# Patient Record
Sex: Female | Born: 1990 | Race: Black or African American | Hispanic: No | Marital: Single | Smoking: Never smoker
Health system: Southern US, Community
[De-identification: ages and names within clinical notes are randomized; demographics above are authoritative.]

## PROBLEM LIST (undated history)

## (undated) ENCOUNTER — Inpatient Hospital Stay (HOSPITAL_COMMUNITY): Payer: Self-pay

## (undated) DIAGNOSIS — O1490 Unspecified pre-eclampsia, unspecified trimester: Secondary | ICD-10-CM

## (undated) DIAGNOSIS — Z9109 Other allergy status, other than to drugs and biological substances: Secondary | ICD-10-CM

## (undated) DIAGNOSIS — D649 Anemia, unspecified: Secondary | ICD-10-CM

## (undated) DIAGNOSIS — O24419 Gestational diabetes mellitus in pregnancy, unspecified control: Secondary | ICD-10-CM

## (undated) HISTORY — DX: Gestational diabetes mellitus in pregnancy, unspecified control: O24.419

## (undated) HISTORY — PX: NO PAST SURGERIES: SHX2092

## (undated) HISTORY — DX: Anemia, unspecified: D64.9

## (undated) HISTORY — DX: Unspecified pre-eclampsia, unspecified trimester: O14.90

## (undated) HISTORY — DX: Other allergy status, other than to drugs and biological substances: Z91.09

---

## 2012-11-29 ENCOUNTER — Emergency Department (HOSPITAL_COMMUNITY)
Admission: EM | Admit: 2012-11-29 | Discharge: 2012-11-29 | Disposition: A | Discharge status: Home / Self Care | Payer: No Typology Code available for payment source | Attending: Emergency Medicine | Admitting: Emergency Medicine

## 2012-11-29 ENCOUNTER — Encounter (HOSPITAL_COMMUNITY): Payer: Self-pay | Admitting: *Deleted

## 2012-11-29 DIAGNOSIS — Y92410 Unspecified street and highway as the place of occurrence of the external cause: Secondary | ICD-10-CM | POA: Insufficient documentation

## 2012-11-29 DIAGNOSIS — Y9389 Activity, other specified: Secondary | ICD-10-CM | POA: Insufficient documentation

## 2012-11-29 DIAGNOSIS — S39012A Strain of muscle, fascia and tendon of lower back, initial encounter: Secondary | ICD-10-CM

## 2012-11-29 DIAGNOSIS — S335XXA Sprain of ligaments of lumbar spine, initial encounter: Secondary | ICD-10-CM | POA: Insufficient documentation

## 2012-11-29 MED ORDER — CYCLOBENZAPRINE HCL 10 MG PO TABS: 10.0000 mg | ORAL_TABLET | Freq: Two times a day (BID) | ORAL | Status: DC | PRN
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
## 2014-06-15 ENCOUNTER — Encounter: Payer: Self-pay | Admitting: Medical

## 2014-06-15 ENCOUNTER — Ambulatory Visit (INDEPENDENT_AMBULATORY_CARE_PROVIDER_SITE_OTHER): Payer: BC Managed Care – PPO | Admitting: Medical

## 2014-06-15 VITALS — BP 110/78 | HR 82 | Temp 98.4°F | Resp 16 | Ht 64.0 in | Wt 248.0 lb

## 2014-06-15 DIAGNOSIS — N62 Hypertrophy of breast: Secondary | ICD-10-CM

## 2014-06-15 DIAGNOSIS — E669 Obesity, unspecified: Secondary | ICD-10-CM

## 2014-06-15 DIAGNOSIS — M545 Low back pain, unspecified: Secondary | ICD-10-CM

## 2014-06-15 LAB — COMPREHENSIVE METABOLIC PANEL
ALBUMIN: 3.8 g/dL (ref 3.5–5.2)
ALT: 9 U/L (ref 0–35)
AST: 14 U/L (ref 0–37)
Alkaline Phosphatase: 57 U/L (ref 39–117)
BUN: 9 mg/dL (ref 6–23)
CALCIUM: 9.2 mg/dL (ref 8.4–10.5)
CHLORIDE: 105 meq/L (ref 96–112)
CO2: 25 mEq/L (ref 19–32)
Creat: 0.7 mg/dL (ref 0.50–1.10)
GLUCOSE: 102 mg/dL — AB (ref 70–99)
POTASSIUM: 4.2 meq/L (ref 3.5–5.3)
Sodium: 139 mEq/L (ref 135–145)
Total Bilirubin: 0.4 mg/dL (ref 0.2–1.2)
Total Protein: 7.2 g/dL (ref 6.0–8.3)

## 2014-06-15 LAB — LIPID PANEL
Cholesterol: 187 mg/dL (ref 0–200)
HDL: 56 mg/dL (ref >39)
LDL Cholesterol: 115 mg/dL — ABNORMAL HIGH (ref 0–99)
Total CHOL/HDL Ratio: 3.3 Ratio
Triglycerides: 82 mg/dL (ref <150)
VLDL: 16 mg/dL (ref 0–40)

## 2014-06-15 NOTE — Progress Notes (Signed)
    Subjective:   Amy Fitzgerald is a 23 y.o. female presenting on 06/15/2014 with Consult about breast reduction  Here today with her mother who is a patient of mine.    She wants to have referral for breast reduction surgery.  Has had large breasts since early high school.  Has been having back pain in the last year that she attributes to the breasts.  She notes her ideal weight is 176 pounds. This is what she weighed in early high school.  She currently is 248 pounds. She currently is not exercising at all, has not been one to exercise in general, played no sports in high school.  She uses no diet discretion, does eat a lot of candy and fast food. Otherwise in usual state of health without complaint  Review of Systems Review of systems as in subjective     Objective:   BP 110/78  Pulse 82  Temp(Src) 98.4 F (36.9 C) (Oral)  Resp 16  Ht 5\' 4"  (1.626 m)  Wt 248 lb (112.492 kg)  BMI 42.55 kg/m2  General appearance: alert, no distress, WD/WN,  Neck: supple, no lymphadenopathy, no thyromegaly, no masses Heart: RRR, normal S1, S2, no murmurs Lungs: CTA bilaterally, no wheezes, rhonchi, or rales Pulses: 2+ symmetric, upper and lower extremities, normal cap refill Ext: no edema      Assessment: Encounter Diagnoses  Name Primary?  . Obesity, unspecified Yes  . Low back pain without sciatica, unspecified back pain laterality   . Large breasts      Plan: We discussed her concerns, back pain that is probably related more to her weight in general than her breast, discussed risk of surgery as well as scarring from breast reduction surgery.  At this time I recommend we focus on general healthy diet and exercise and weight loss.  We spent a long time talking about diet, exercise, setting goals. We'll do some baseline labs for screening.  She seems motivated to work on this, and as long as labs are normal we'll see her back in 2-3 months.  No referral for surgery at this time   Amy Fitzgerald  was seen today for consult about breast reduction.  Diagnoses and associated orders for this visit:  Obesity, unspecified - Comprehensive metabolic panel - Lipid panel - TSH - Hemoglobin A1c  Low back pain without sciatica, unspecified back pain laterality - Comprehensive metabolic panel - Lipid panel - TSH - Hemoglobin A1c  Large breasts - Comprehensive metabolic panel - Lipid panel - TSH - Hemoglobin A1c    Return pending labs, recheck 2-3 mo.

## 2014-06-15 NOTE — Patient Instructions (Signed)
  Thank you for giving me the opportunity to serve you today.    Your diagnosis today includes: Encounter Diagnoses  Name Primary?  . Large breasts Yes  . Low back pain without sciatica, unspecified back pain laterality   . Obesity, unspecified     Specific recommendations today include: Diet  Increase your water intake, get at least 64 ounces of water daily  Eat 3-4 fruits daily  Eat plenty of vegetables throughout the day, preferably each meal  Eat good sources of grains such as oatmeal, barley, whole grain pasta, whole grain bread, but limit the serving size to 1 cup of oatmeal or pasta per meal or 2 slices of bread per meal  We don't need to meat at each meal, however if you do eat meat, limit serving size to the size of your palm, and eat chicken fish or [place], lean cuts of meat  Eat beans every day as this is a good nutrient source and helps to curb appetite  Consider using a program such as Weight Watchers  Consider using a Smart phone app such as My [employer] or [employer] to track your calories and progress   Things to limit or avoid:  Avoid fast food, fried foods, fatty foods  Limit sweets, ice cream, cake and other baked goods  Avoid soda, beer, alcohol, sweet tea  Exercise  You need to be exercising most days of the week for 30-45 minutes or more  Good forms of exercise include walking, hiking, stationary bike or bicycling outside, lap swimming, aerobics class, dance, Zumba  Consider getting a trainer at a gym to help with exercise  Consider weighing yourself daily to keep track of your weight  We will call with lab results.

## 2014-06-16 LAB — HEMOGLOBIN A1C
Hgb A1c MFr Bld: 6.1 % — ABNORMAL HIGH (ref <5.7)
Mean Plasma Glucose: 128 mg/dL — ABNORMAL HIGH (ref <117)

## 2014-06-16 LAB — TSH: TSH: 1.396 u[IU]/mL (ref 0.350–4.500)

## 2014-09-07 ENCOUNTER — Encounter: Payer: Self-pay | Admitting: Medical

## 2014-09-07 ENCOUNTER — Ambulatory Visit (INDEPENDENT_AMBULATORY_CARE_PROVIDER_SITE_OTHER): Payer: BC Managed Care – PPO | Admitting: Medical

## 2014-09-07 VITALS — BP 100/80 | HR 80 | Temp 97.5°F | Resp 16 | Wt 253.0 lb

## 2014-09-07 DIAGNOSIS — E669 Obesity, unspecified: Secondary | ICD-10-CM

## 2014-09-07 DIAGNOSIS — R7309 Other abnormal glucose: Secondary | ICD-10-CM

## 2014-09-07 DIAGNOSIS — M545 Low back pain: Secondary | ICD-10-CM

## 2014-09-07 DIAGNOSIS — R7303 Prediabetes: Secondary | ICD-10-CM

## 2014-09-07 MED ORDER — PHENTERMINE HCL 37.5 MG PO TABS: 37.5000 mg | ORAL_TABLET | Freq: Every day | ORAL | Status: DC

## 2014-09-07 NOTE — Progress Notes (Signed)
Subjective: Here for f/u.  At her first visit in July, we discussed her concerns about back pain, large breasts, and obesity, was found to have borderline diabetes on labs.   2 x /week with trainer for 30 min.  Using weights, row machine, aerobic work.   No other exercise.    Feels like she has made some changes with diet.  drinking some water "enough."   Drinks on average 1 soda daily.   Avoids sweets.  Does eat large portions, but skips breakfast.   Does eat a lot of bread.  Not much with meat.   Is having less back pain and less cramping with periods since exercising.  No other c/o.  Objective: BP 100/80  Pulse 80  Temp(Src) 97.5 F (36.4 C) (Oral)  Resp 16  Wt 253 lb (114.76 kg)  Wt Readings from Last 3 Encounters:  09/07/14 253 lb (114.76 kg)  06/15/14 248 lb (112.492 kg)   General appearance: alert, no distress, WD/WN Heart: RRR, normal S1, S2, no murmurs Lungs: CTA bilaterally, no wheezes, rhonchi, or rales Pulses: 2+ symmetric, upper and lower extremities, normal cap refill   Assessment: Encounter Diagnoses  Name Primary?  . Obesity Yes  . Borderline diabetes   . Low back pain without sciatica, unspecified back pain laterality    Plan: Glad to hear her back pain is much improved.  She has made some diet and exercise changes, but not intense enough.   Discussed her labs and concerns from July.  Discussed the need to be more aggressive with weight loss efforts.  Again reiterated diet recommendations, exercise recommendations, water intake.  discussed medications that can help.   Begin Phentermine.  Discussed risks/benefits of medication.  Declines flu vaccine.  Follow up 1-2 mo.

## 2014-11-07 ENCOUNTER — Ambulatory Visit: Payer: BC Managed Care – PPO | Admitting: Medical

## 2014-11-12 ENCOUNTER — Ambulatory Visit: Payer: BC Managed Care – PPO | Admitting: Medical

## 2014-11-13 ENCOUNTER — Other Ambulatory Visit: Payer: Self-pay | Admitting: Family Medicine

## 2014-11-13 ENCOUNTER — Ambulatory Visit (INDEPENDENT_AMBULATORY_CARE_PROVIDER_SITE_OTHER): Payer: BC Managed Care – PPO | Admitting: Medical

## 2014-11-13 ENCOUNTER — Telehealth: Payer: Self-pay | Admitting: Medical

## 2014-11-13 VITALS — BP 102/80 | HR 75 | Temp 98.2°F | Wt 245.0 lb

## 2014-11-13 DIAGNOSIS — E669 Obesity, unspecified: Secondary | ICD-10-CM

## 2014-11-13 DIAGNOSIS — R7301 Impaired fasting glucose: Secondary | ICD-10-CM

## 2014-11-13 MED ORDER — PHENTERMINE HCL 37.5 MG PO TABS: 37.5000 mg | ORAL_TABLET | Freq: Every day | ORAL | Status: DC

## 2014-11-13 NOTE — Telephone Encounter (Signed)
I placed the orders in EPIC

## 2014-11-13 NOTE — Telephone Encounter (Signed)
Refer to nutritionist 

## 2014-11-13 NOTE — Progress Notes (Signed)
Subjective: Obesity: Here for f/u regarding weight loss efforts.  Last visit for the same was 09/07/14 Current diet: "its crap," doesn't eat breakfast, eats fast food all the time for lunch, dinner Current exercise: goes to [employer] once per week Current medications to assist weight loss efforts: began phentermine 10/2 visit.  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  Interested seeing nutritionist Mood is fine, not getting a lot of sleep.   Wt Readings from Last 3 Encounters:  11/13/14 245 lb (111.131 kg)  09/07/14 253 lb (114.76 kg)  06/15/14 248 lb (112.492 kg)   History of eating disorders: none.  Previous treatments for obesity include none. Obesity associated medical conditions: impaired fasting glucose. Current or past medications associated with causing obesity: none.   ROS as in subjective:  Objective: Filed Vitals:   11/13/14 0851  BP: 102/80  Pulse: 75  Temp: 98.2 F (36.8 C)   General appearance: alert, no distress, WD/WN Oral cavity: MMM, no lesions Neck: supple, no lymphadenopathy, no thyromegaly, no masses Heart: RRR, normal S1, S2, no murmurs Lungs: CTA bilaterally, no wheezes, rhonchi, or rales Abdomen: +bs, soft, non tender, non distended, no masses, no hepatomegaly, no splenomegaly Pulses: 2+ symmetric, upper and lower extremities, normal cap refill Ext: no edema  Assessment: Encounter Diagnoses  Name Primary?  . Obesity Yes  . Impaired fasting blood sugar     Plan: Counseled mainly today on exercise and diet, trying to get away from fast food, eating healthier, getting exercise every day, continue phentermine. She lost 8 pounds since last visit. We'll refer to nutritionist. Follow-up in 2 months.

## 2015-01-01 ENCOUNTER — Ambulatory Visit: Payer: Self-pay | Admitting: Dietician

## 2015-01-14 ENCOUNTER — Encounter: Payer: Self-pay | Admitting: Medical

## 2015-01-14 ENCOUNTER — Ambulatory Visit (INDEPENDENT_AMBULATORY_CARE_PROVIDER_SITE_OTHER): Payer: BC Managed Care – PPO | Admitting: Medical

## 2015-01-14 VITALS — BP 100/80 | HR 76 | Temp 98.1°F | Resp 15 | Wt 240.0 lb

## 2015-01-14 DIAGNOSIS — R7301 Impaired fasting glucose: Secondary | ICD-10-CM

## 2015-01-14 DIAGNOSIS — E669 Obesity, unspecified: Secondary | ICD-10-CM

## 2015-01-14 LAB — POCT GLYCOSYLATED HEMOGLOBIN (HGB A1C): HEMOGLOBIN A1C: 5.7

## 2015-01-14 MED ORDER — PHENTERMINE HCL 37.5 MG PO TABS: 37.5000 mg | ORAL_TABLET | Freq: Every day | ORAL | Status: DC

## 2015-01-14 NOTE — Progress Notes (Signed)
Subjective: Here for f/u on obesity, medication for weight loss, Phentermine.  Last visit 11/13/14 for the same. She is really been trying to work on diet, has really been more careful avoiding sweets.   Has sweet tooth, likes candy.  Lately cooks a lot at home, finds it cheaper to cook at home.  Not as much fast food.  Walks but no other specific exercise.   Works as Conservation officer, naturecashier at [employer].  Her and boyfriend will be starting to go to a gym soon.  Uses phentermine some, not daily, and sometimes forgets to take it but does want to c/t this.  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   No other aggravating or relieving factors. No other complaint.  Wt Readings from Last 3 Encounters:  01/14/15 240 lb (108.863 kg)  11/13/14 245 lb (111.131 kg)  09/07/14 253 lb (114.76 kg)   History of eating disorders: none.  Previous treatments for obesity include none. Obesity associated medical conditions: impaired fasting glucose. Current or past medications associated with causing obesity: none.   ROS as in subjective:  Objective: BP 100/80 mmHg  Pulse 76  Temp(Src) 98.1 F (36.7 C) (Oral)  Resp 15  Wt 240 lb (108.863 kg)  General appearance: alert, no distress, WD/WN Oral cavity: MMM, no lesions Neck: supple, no lymphadenopathy, no thyromegaly, no masses Heart: RRR, normal S1, S2, no murmurs Lungs: CTA bilaterally, no wheezes, rhonchi, or rales Pulses: 2+ symmetric, upper and lower extremities, normal cap refill Ext: no edema  Assessment: Encounter Diagnoses  Name Primary?  . Obesity Yes  . Impaired fasting blood sugar     Plan: HgbA1C 5.7% today.  Improved from last visit.  She has lost 5 more lbs.  Advised c/t efforts with diet, exercise, c/t Phentermine, takes intermittently, but advised daily for the next 2 months .  She will rescheduled visit with nutritionist.  Follow-up in 2 months.

## 2015-01-24 ENCOUNTER — Ambulatory Visit: Payer: Self-pay | Admitting: Dietician

## 2015-08-13 ENCOUNTER — Encounter: Payer: Self-pay | Admitting: Family Medicine

## 2015-08-13 ENCOUNTER — Ambulatory Visit (INDEPENDENT_AMBULATORY_CARE_PROVIDER_SITE_OTHER): Payer: BC Managed Care – PPO | Admitting: Family Medicine

## 2015-08-13 ENCOUNTER — Other Ambulatory Visit: Payer: Self-pay | Admitting: Family Medicine

## 2015-08-13 VITALS — BP 116/78 | HR 64 | Wt 268.8 lb

## 2015-08-13 DIAGNOSIS — Z3200 Encounter for pregnancy test, result unknown: Secondary | ICD-10-CM | POA: Diagnosis not present

## 2015-08-13 DIAGNOSIS — Z3490 Encounter for supervision of normal pregnancy, unspecified, unspecified trimester: Secondary | ICD-10-CM

## 2015-08-13 DIAGNOSIS — Z3201 Encounter for pregnancy test, result positive: Secondary | ICD-10-CM

## 2015-08-13 NOTE — Patient Instructions (Signed)
We will notify you when your lab result is back, tomorrow. Start taking a good multivitamin with 400 mcg of folic acid. Make sure you're eating a healthy diet, staying hydrated, and getting adequate sleep. Avoid starting any new medications.   Commonly Asked Questions During Pregnancy  Cats: A parasite can be excreted in cat feces.  To avoid exposure you need to have another person empty the little box.  If you must empty the litter box you will need to wear gloves.  Wash your hands after handling your cat.  This parasite can also be found in raw or undercooked meat so this should also be avoided.  Colds, Sore Throats, Flu: Please check your medication sheet to see what you can take for symptoms.  If your symptoms are unrelieved by these medications please call the office.  Dental Work: Most any dental work Agricultural consultant recommends is permitted.  X-rays should only be taken during the first trimester if absolutely necessary.  Your abdomen should be shielded with a lead apron during all x-rays.  Please notify your provider prior to receiving any x-rays.  Novocaine is fine; gas is not recommended.  If your dentist requires a note from [place] prior to dental work please call the office and we will provide one for you.  Exercise: Exercise is an important part of staying healthy during your pregnancy.  You may continue most exercises you were accustomed to prior to pregnancy.  Later in your pregnancy you will most likely notice you have difficulty with activities requiring balance like riding a bicycle.  It is important that you listen to your body and avoid activities that put you at a higher risk of falling.  Adequate rest and staying well hydrated are a must!  If you have questions about the safety of specific activities ask your provider.    Exposure to Children with illness: Try to avoid obvious exposure; report any symptoms to [place] when noted,  If you have chicken pos, red measles or mumps, you should be  immune to these diseases.   Please do not take any vaccines while pregnant unless you have checked with your OB provider.  Fetal Movement: After 28 weeks we recommend you do "kick counts" twice daily.  Lie or sit down in a calm quiet environment and count your baby movements "kicks".  You should feel your baby at least 10 times per hour.  If you have not felt 10 kicks within the first hour get up, walk around and have something sweet to eat or drink then repeat for an additional hour.  If count remains less than 10 per hour notify your provider.  Fumigating: Follow your pest control agent's advice as to how long to stay out of your home.  Ventilate the area well before re-entering.  Hemorrhoids:   Most over-the-counter preparations can be used during pregnancy.  Check your medication to see what is safe to use.  It is important to use a stool softener or fiber in your diet and to drink lots of liquids.  If hemorrhoids seem to be getting worse please call the office.   Hot Tubs:  Hot tubs Jacuzzis and saunas are not recommended while pregnant.  These increase your internal body temperature and should be avoided.  Intercourse:  Sexual intercourse is safe during pregnancy as long as you are comfortable, unless otherwise advised by your provider.  Spotting may occur after intercourse; report any bright red bleeding that is heavier than spotting.  Labor:  If you know that you are in labor, please go to the hospital.  If you are unsure, please call the office and let us help you decide what to do.  Lifting, straining, etc:  If your job requires heavy lifting or straining please check with your provider for any limitations.  Generally, you should not lift items heavier than that you can lift simply with your hands and arms (no back muscles)  Painting:  Paint fumes do not harm your pregnancy, but may make you ill and should be avoided if possible.  Latex or water based paints have less odor than oils.  Use  adequate ventilation while painting.  Permanents & Hair Color:  Chemicals in hair dyes are not recommended as they cause increase hair dryness which can increase hair loss during pregnancy.  " Highlighting" and permanents are allowed.  Dye may be absorbed differently and permanents may not hold as well during pregnancy.  Sunbathing:  Use a sunscreen, as skin burns easily during pregnancy.  Drink plenty of fluids; avoid over heating.  Tanning Beds:  Because their possible side effects are still unknown, tanning beds are not recommended.  Ultrasound Scans:  Routine ultrasounds are performed at approximately 20 weeks.  You will be able to see your baby's general anatomy an if you would like to know the gender this can usually be determined as well.  If it is questionable when you conceived you may also receive an ultrasound early in your pregnancy for dating purposes.  Otherwise ultrasound exams are not routinely performed unless there is a medical necessity.  Although you can request a scan we ask that you pay for it when conducted because insurance does not cover " patient request" scans.  Work: If your pregnancy proceeds without complications you may work until your due date, unless your physician or employer advises otherwise.  Round Ligament Pain/Pelvic Discomfort:  Sharp, shooting pains not associated with bleeding are fairly common, usually occurring in the second trimester of pregnancy.  They tend to be worse when standing up or when you remain standing for long periods of time.  These are the result of pressure of certain pelvic ligaments called "round ligaments".  Rest, Tylenol and heat seem to be the most effective relief.  As the womb and fetus grow, they rise out of the pelvis and the discomfort improves.  Please notify the office if your pain seems different than that described.  It may represent a more serious condition.

## 2015-08-13 NOTE — Progress Notes (Signed)
   Subjective:    Patient ID: Amy Fitzgerald, female    DOB: Jan 12, 1991, 24 y.o.   MRN: [id number]  HPI  She is here to confirm pregnancy and states her last period was in July, around the 25th. Reports her cycles are usually every 28 days. She would like to confirm pregnancy. She states she had a positive pregnancy test at home. She also reports breast tenderness and mild lower abdominal cramping on and off for past 2 weeks and states is unrelated to eating or urinating. States she was not planning to become pregnant but it planning to keep the baby if she is indeed pregnant. She denies having any health problems. States she is not taking any medications. Reports that she has never been to an OB/GYN.  Her significant other is with her today. Denies fever, chills, nausea, vomiting, diarrhea, constipation, urinary frequency, dysuria, or odor, back pain. Denies vaginal bleeding or discharge.  Discussed that her labs last year showed that she had impaired fasting glucose and she states that she did not follow-up with the nutritionist as recommended. States she has not been dieting or exercising.  Reviewed allergies, medications, past medical history. She has no previous pregnancies. She denies smoking, alcohol use, drug use.  Review of Systems Pertinent positives and negatives in the history of present illness.    Objective:   Physical Exam  Constitutional: She is oriented to person, place, and time. She appears well-developed and well-nourished. No distress.  Neurological: She is alert and oriented to person, place, and time.  Skin: Skin is warm and dry. No rash noted.  Psychiatric: She has a normal mood and affect. Her speech is normal and behavior is normal. Judgment and thought content normal. Cognition and memory are normal.          Assessment & Plan:  Encounter for pregnancy test - Plan: hCG, serum, qualitative  Discussed that we will refer her to OB/GYN pending positive serum pregnancy  test. Advised her to start taking Multi-vitamin with  of Folic Acid. She will avoid taking any medications until consulting with [place] or her OB/GYN. Encouraged her to continue taking good care of herself by eating properly, staying hydrated, walking for physical activity and getting adequate sleep. Encouraged her to get a flu shot today. She refused and states she does not get them due to religious reasons.

## 2015-08-14 LAB — HCG, SERUM, QUALITATIVE: PREG SERUM: POSITIVE

## 2015-08-14 LAB — HCG, QUANTITATIVE, PREGNANCY: hCG, Beta Chain, Quant, S: 28933.6 m[IU]/mL

## 2015-08-14 NOTE — Addendum Note (Signed)
Addended by: Herminio Commons A on: 08/14/2015 12:45 PM   Modules accepted: Orders

## 2015-08-15 ENCOUNTER — Encounter: Payer: Self-pay | Admitting: Family Medicine

## 2015-09-03 LAB — OB RESULTS CONSOLE HIV ANTIBODY (ROUTINE TESTING): HIV: NONREACTIVE

## 2015-09-03 LAB — OB RESULTS CONSOLE ABO/RH: RH TYPE: POSITIVE

## 2015-09-03 LAB — OB RESULTS CONSOLE RUBELLA ANTIBODY, IGM: Rubella: IMMUNE

## 2015-09-03 LAB — OB RESULTS CONSOLE HEPATITIS B SURFACE ANTIGEN: Hepatitis B Surface Ag: NEGATIVE

## 2015-09-03 LAB — OB RESULTS CONSOLE RPR: RPR: NONREACTIVE

## 2015-09-03 LAB — OB RESULTS CONSOLE ANTIBODY SCREEN: Antibody Screen: NEGATIVE

## 2015-09-03 LAB — OB RESULTS CONSOLE GC/CHLAMYDIA
Chlamydia: NEGATIVE
GC PROBE AMP, GENITAL: NEGATIVE

## 2015-12-06 ENCOUNTER — Emergency Department (HOSPITAL_COMMUNITY)
Admission: EM | Admit: 2015-12-06 | Discharge: 2015-12-06 | Disposition: A | Discharge status: Home / Self Care | Payer: Medicaid Other | Attending: Emergency Medicine | Admitting: Emergency Medicine

## 2015-12-06 ENCOUNTER — Encounter (HOSPITAL_COMMUNITY): Payer: Self-pay | Admitting: Emergency Medicine

## 2015-12-06 DIAGNOSIS — Z79899 Other long term (current) drug therapy: Secondary | ICD-10-CM | POA: Diagnosis not present

## 2015-12-06 DIAGNOSIS — K529 Noninfective gastroenteritis and colitis, unspecified: Secondary | ICD-10-CM | POA: Diagnosis not present

## 2015-12-06 DIAGNOSIS — O9989 Other specified diseases and conditions complicating pregnancy, childbirth and the puerperium: Secondary | ICD-10-CM | POA: Diagnosis present

## 2015-12-06 DIAGNOSIS — O99612 Diseases of the digestive system complicating pregnancy, second trimester: Secondary | ICD-10-CM | POA: Insufficient documentation

## 2015-12-06 DIAGNOSIS — Z3490 Encounter for supervision of normal pregnancy, unspecified, unspecified trimester: Secondary | ICD-10-CM

## 2015-12-06 DIAGNOSIS — Z3A22 22 weeks gestation of pregnancy: Secondary | ICD-10-CM | POA: Insufficient documentation

## 2015-12-06 LAB — URINALYSIS, ROUTINE W REFLEX MICROSCOPIC
Bilirubin Urine: NEGATIVE
Glucose, UA: NEGATIVE mg/dL
Hgb urine dipstick: NEGATIVE
Ketones, ur: NEGATIVE mg/dL
Nitrite: NEGATIVE
Protein, ur: NEGATIVE mg/dL
Specific Gravity, Urine: 1.021 (ref 1.005–1.030)
pH: 6 (ref 5.0–8.0)

## 2015-12-06 LAB — COMPREHENSIVE METABOLIC PANEL
ALT: 14 U/L (ref 14–54)
AST: 20 U/L (ref 15–41)
Albumin: 3.1 g/dL — ABNORMAL LOW (ref 3.5–5.0)
Alkaline Phosphatase: 49 U/L (ref 38–126)
Anion gap: 8 (ref 5–15)
BUN: 6 mg/dL (ref 6–20)
CO2: 22 mmol/L (ref 22–32)
Calcium: 8.7 mg/dL — ABNORMAL LOW (ref 8.9–10.3)
Chloride: 105 mmol/L (ref 101–111)
Creatinine, Ser: 0.57 mg/dL (ref 0.44–1.00)
GFR calc Af Amer: >60 mL/min (ref >60)
GFR calc non Af Amer: >60 mL/min (ref >60)
Glucose, Bld: 99 mg/dL (ref 65–99)
Potassium: 3.4 mmol/L — ABNORMAL LOW (ref 3.5–5.1)
Sodium: 135 mmol/L (ref 135–145)
Total Bilirubin: 0.5 mg/dL (ref 0.3–1.2)
Total Protein: 7.2 g/dL (ref 6.5–8.1)

## 2015-12-06 LAB — CBC
HCT: 35 % — ABNORMAL LOW (ref 36.0–46.0)
Hemoglobin: 11.5 g/dL — ABNORMAL LOW (ref 12.0–15.0)
MCH: 27.6 pg (ref 26.0–34.0)
MCHC: 32.9 g/dL (ref 30.0–36.0)
MCV: 83.9 fL (ref 78.0–100.0)
Platelets: 267 10*3/uL (ref 150–400)
RBC: 4.17 MIL/uL (ref 3.87–5.11)
RDW: 13.4 % (ref 11.5–15.5)
WBC: 5.8 10*3/uL (ref 4.0–10.5)

## 2015-12-06 LAB — URINE MICROSCOPIC-ADD ON

## 2015-12-06 LAB — LIPASE, BLOOD: Lipase: 25 U/L (ref 11–51)

## 2015-12-06 LAB — I-STAT BETA HCG BLOOD, ED (MC, WL, AP ONLY): I-stat hCG, quantitative: >2000 m[IU]/mL — ABNORMAL HIGH (ref <5)

## 2015-12-06 MED ORDER — PROMETHAZINE HCL 25 MG PO TABS: 25.0000 mg | ORAL_TABLET | Freq: Three times a day (TID) | ORAL | Status: DC | PRN

## 2015-12-06 MED ORDER — SODIUM CHLORIDE 0.9 % IV BOLUS (SEPSIS)
1000.0000 mL | Freq: Once | INTRAVENOUS | Status: AC
  Administered 2015-12-06: 1000 mL via INTRAVENOUS

## 2015-12-06 MED ORDER — ONDANSETRON HCL 4 MG/2ML IJ SOLN
4.0000 mg | Freq: Once | INTRAMUSCULAR | Status: AC
  Administered 2015-12-06: 4 mg via INTRAVENOUS
  Filled 2015-12-06: qty 2

## 2015-12-06 NOTE — ED Notes (Signed)
OB RN at bedside

## 2015-12-06 NOTE — Progress Notes (Signed)
1152 Arrived to evaluate this 24yo G1P0 @ 22.[redacted] wks GA in with report of nausea, vomiting, diarrhea. Reports hematemesis.  Denies bleeding or leaking of fluid from vagina.  Reports feeling fetal movement as "fluttering". FHT's 145 suprapubic.  1207  Spoke with Dr. Su Hiltoberts and informed her of above.  She states patient can be OB cleared.

## 2015-12-06 NOTE — Discharge Instructions (Signed)
Return here as needed.  Follow-up with your GYN doctor, increase your fluid intake

## 2015-12-06 NOTE — ED Notes (Signed)
1x unsuccessful lab draw R hand

## 2015-12-06 NOTE — ED Provider Notes (Signed)
CSN: [id number]     Arrival date & time 12/06/15  1117 History   First MD Initiated Contact with Patient 12/06/15 1345     No chief complaint on file.    (Consider location/radiation/quality/duration/timing/severity/associated sxs/prior Treatment) HPI Patient presents to the emergency department with nausea, vomiting and diarrhea over the last 36 hours.  Patient states that he is [redacted] weeks pregnant.  Feels like she caught a GI bug.  She states she noticed a little streak in the vomitus.  Patient states she does not have any abdominal discomfort at this time.  The patient states she did have some crampy abdominal pain yesterday.  Patient states that her diarrhea has gotten better but she still had some vomiting this morning.  The patient denies headache, blurred vision, back pain, neck pain, fever, dysuria, incontinence, bloody stool, cough, chest pain, shortness of breath, rash, near syncope or syncope.  She states that nothing seems make her condition better or worse History reviewed. No pertinent past medical history. History reviewed. No pertinent past surgical history. No family history on file. Social History  Substance Use Topics  . Smoking status: Never Smoker   . Smokeless tobacco: None  . Alcohol Use: No   OB History    Gravida Para Term Preterm AB TAB SAB Ectopic Multiple Living   1              Review of Systems All other systems negative except as documented in the HPI. All pertinent positives and negatives as reviewed in the HPI.   Allergies  Review of patient's allergies indicates no known allergies.  Home Medications   Prior to Admission medications   Medication Sig Start Date End Date Taking? Authorizing Provider  Prenatal Multivit-Min-Fe-FA (PRE-NATAL PO) Take 1 tablet by mouth daily.   Yes Historical Provider, MD   BP 126/101 mmHg  Pulse 81  Temp(Src) 98.6 F (37 C) (Oral)  Resp 16  SpO2 100%  LMP 07/07/2015 Physical Exam  Constitutional: She is oriented  to person, place, and time. She appears well-developed and well-nourished. No distress.  HENT:  Head: Normocephalic and atraumatic.  Mouth/Throat: Oropharynx is clear and moist.  Eyes: Pupils are equal, round, and reactive to light.  Neck: Normal range of motion. Neck supple.  Cardiovascular: Normal rate, regular rhythm and normal heart sounds.  Exam reveals no gallop and no friction rub.   No murmur heard. Pulmonary/Chest: Effort normal and breath sounds normal. No respiratory distress. She has no wheezes.  Abdominal: Soft. Bowel sounds are normal. She exhibits no distension. There is no tenderness.  Neurological: She is alert and oriented to person, place, and time. She exhibits normal muscle tone. Coordination normal.  Skin: Skin is warm and dry. No rash noted. No erythema.  Psychiatric: She has a normal mood and affect. Her behavior is normal.  Nursing note and vitals reviewed.   ED Course  Procedures (including critical care time) Labs Review Labs Reviewed  COMPREHENSIVE METABOLIC PANEL - Abnormal; Notable for the following:    Potassium 3.4 (*)    Calcium 8.7 (*)    Albumin 3.1 (*)    All other components within normal limits  CBC - Abnormal; Notable for the following:    Hemoglobin 11.5 (*)    HCT 35.0 (*)    All other components within normal limits  URINALYSIS, ROUTINE W REFLEX MICROSCOPIC (NOT [facility]) - Abnormal; Notable for the following:    Color, Urine AMBER (*)    APPearance TURBID (*)  Leukocytes, UA LARGE (*)    All other components within normal limits  URINE MICROSCOPIC-ADD ON - Abnormal; Notable for the following:    Squamous Epithelial / LPF TOO NUMEROUS TO COUNT (*)    Bacteria, UA MANY (*)    All other components within normal limits  I-STAT BETA HCG BLOOD, ED ([facility], [facility], [facility] ONLY) - Abnormal; Notable for the following:    I-stat hCG, quantitative >2000.0 (*)    All other components within normal limits  URINE CULTURE  LIPASE, BLOOD    Imaging  Review No results found. I have personally reviewed and evaluated these images and lab results as part of my medical decision-making.  Patient be treated for GI type illness.  She is stable here in the emergency department does not have any abdominal pain at this time.  I did culture her urine as there is 2.80 count epithelials, along with bacteria.  Patient is advised follow-up with her primary care doctor and OB    Charlestine Night, [place] 12/06/15 1552  Raeford Razor, MD 12/13/15 1152

## 2015-12-06 NOTE — ED Notes (Signed)
Pt 22 weeks 4 days pregnant c/o hematemesis with blood streaked mucus, diffuse crampy abdominal pain, diarrhea. No vaginal discharge or bleeding.

## 2015-12-08 LAB — URINE CULTURE

## 2015-12-08 NOTE — L&D Delivery Note (Signed)
Called by RN advising that pt was pushing involuntarily and needed to urinate and possibly have a BM. She attempted to get out of bed, but the pain was too unbearable and she was assisted back to bed. Overall reassuring FHRT. +Earlys, mild variables, no lates. Ctxs every 2-3 min, palpate moderate. Cervical exam per RN = C/C/+1. Advised that she may begin pushing. In less than 5 min, called to room for imminent delivery (head on the perineum).    Delivery Note At 5:35 AM a viable female "Amy Fitzgerald" was delivered via Vaginal, Spontaneous Delivery (Presentation: OA restituting to Right Occiput Anterior). APGARS: 8, 9; weight 7 lbs 15.9 oz (3626 g).   Placenta status: Intact, Spontaneous Schultz - mec stained. Cord: 3 vessels with the following complications: None. Cord pH: NA.  Anesthesia: Local for repair.  Episiotomy: None. Lacerations: 2nd degree vaginal; bilateral inner labia. Suture Repair: 2-0; 3-0 vicryl CT-1. Est. Blood Loss (mL): 300  Mom to postpartum.  Baby to Couplet care / Skin to Skin.  Mom plans to breastfeed/pump.  Undecided re: birth control.  Placenta to path.  Due to preE w/ severe features, will begin Magnesium Sulfate x 24 hrs. Pt asymptomatic. Will obtain preE labs and Magnesium level in am, sooner if indicated. Continue IV anti-hypertensive therapy prn.  Amy Fitzgerald, Marlise Fahr 04/01/2016, 7:03 AM

## 2016-01-23 ENCOUNTER — Inpatient Hospital Stay (HOSPITAL_COMMUNITY): Payer: Medicaid Other

## 2016-01-23 ENCOUNTER — Inpatient Hospital Stay (HOSPITAL_COMMUNITY)
Admission: AD | Admit: 2016-01-23 | Discharge: 2016-01-23 | Disposition: A | Discharge status: Home / Self Care | Payer: Medicaid Other | Source: Ambulatory Visit | Attending: Obstetrics & Gynecology | Admitting: Obstetrics & Gynecology

## 2016-01-23 ENCOUNTER — Encounter (HOSPITAL_COMMUNITY): Payer: Self-pay | Admitting: *Deleted

## 2016-01-23 DIAGNOSIS — O26899 Other specified pregnancy related conditions, unspecified trimester: Secondary | ICD-10-CM

## 2016-01-23 DIAGNOSIS — O26893 Other specified pregnancy related conditions, third trimester: Secondary | ICD-10-CM | POA: Diagnosis not present

## 2016-01-23 DIAGNOSIS — O99213 Obesity complicating pregnancy, third trimester: Secondary | ICD-10-CM | POA: Diagnosis not present

## 2016-01-23 DIAGNOSIS — Z3A29 29 weeks gestation of pregnancy: Secondary | ICD-10-CM

## 2016-01-23 DIAGNOSIS — O368390 Maternal care for abnormalities of the fetal heart rate or rhythm, unspecified trimester, not applicable or unspecified: Secondary | ICD-10-CM

## 2016-01-23 DIAGNOSIS — R109 Unspecified abdominal pain: Secondary | ICD-10-CM | POA: Diagnosis present

## 2016-01-23 DIAGNOSIS — O288 Other abnormal findings on antenatal screening of mother: Secondary | ICD-10-CM

## 2016-01-23 LAB — URINE MICROSCOPIC-ADD ON

## 2016-01-23 LAB — URINALYSIS, ROUTINE W REFLEX MICROSCOPIC
BILIRUBIN URINE: NEGATIVE
GLUCOSE, UA: NEGATIVE mg/dL
Hgb urine dipstick: NEGATIVE
KETONES UR: NEGATIVE mg/dL
NITRITE: NEGATIVE
PH: 5.5 (ref 5.0–8.0)
PROTEIN: NEGATIVE mg/dL
Specific Gravity, Urine: <1.005 — ABNORMAL LOW (ref 1.005–1.030)

## 2016-01-23 NOTE — MAU Provider Note (Signed)
History   25 yo G1P0 at 34 3/7 weeks presented unannounced c/o pain/pressure in upper/mid abdomen while at home, after eating waffles.  Reports she drank ginger ale and burped, and the pain resolved--it has not recurred.  Denies N/V, diarrhea, constipation, bleeding, leaking, contractions, dysuria, discharge, fever, reflux/heartburn or any other issues.  No previous occurrences.  Last seen at [facility] 01/21/16, with plan for initiation of BPPs at [facility] due to BMI.   Patient Active Problem List   Diagnosis Date Noted  . Morbid obesity (HCC) 01/23/2016    Chief Complaint  Patient presents with  . Abdominal Pain   HPI:  See above  OB History    Gravida Para Term Preterm AB TAB SAB Ectopic Multiple Living   1         0      Past Medical History  Diagnosis Date  . Medical history non-contributory     Past Surgical History  Procedure Laterality Date  . No past surgeries      History reviewed. No pertinent family history.  Social History  Substance Use Topics  . Smoking status: Never Smoker   . Smokeless tobacco: None  . Alcohol Use: No    Allergies: No Known Allergies  Prescriptions prior to admission  Medication Sig Dispense Refill Last Dose  . Prenatal Multivit-Min-Fe-FA (PRE-NATAL PO) Take 1 tablet by mouth daily.   Past Month at Unknown time  . promethazine (PHENERGAN) 25 MG tablet Take 1 tablet (25 mg total) by mouth every 8 (eight) hours as needed for nausea or vomiting. (Patient not taking: Reported on 01/23/2016) 10 tablet 0     ROS:  Previous upper abdominal pain/pressure, now resolved. Physical Exam   Blood pressure 132/76, pulse 91, temperature 98.8 F (37.1 C), resp. rate 18, height  (1.626 m), weight 120.748 kg (266 lb 3.2 oz), last menstrual period 07/07/2015.    Physical Exam  In NAD Chest clear Heart RRR without murmur Abd gravid, NT Pelvic--deferred Ext WNL  FHR Category 1, but not classically reactive UCs none  ED Course   Assessment: IUP at 29 3/7 weeks LIkely reflux/gas issue--now resolved Non-reactive FHR Morbid obesity  Plan: BPP   Nigel Bridgeman CNM, MSN 01/23/2016 2:26 PM  Addendum: Returned from US--BPP 8/8, AFI 12.51, 33%ile, vtx.   Results for orders placed or performed during the hospital encounter of 01/23/16 (from the past 24 hour(s))  Urinalysis, Routine w reflex microscopic (not at [facility])     Status: Abnormal   Collection Time: 01/23/16 12:48 PM  Result Value Ref Range   Color, Urine YELLOW YELLOW   APPearance CLEAR CLEAR   Specific Gravity, Urine <1.005 (L) 1.005 - 1.030   pH 5.5 5.0 - 8.0   Glucose, UA NEGATIVE NEGATIVE mg/dL   Hgb urine dipstick NEGATIVE NEGATIVE   Bilirubin Urine NEGATIVE NEGATIVE   Ketones, ur NEGATIVE NEGATIVE mg/dL   Protein, ur NEGATIVE NEGATIVE mg/dL   Nitrite NEGATIVE NEGATIVE   Leukocytes, UA MODERATE (A) NEGATIVE  Urine microscopic-add on     Status: Abnormal   Collection Time: 01/23/16 12:48 PM  Result Value Ref Range   Squamous Epithelial / LPF 0-5 (A) NONE SEEN   WBC, UA 6-30 0 - 5 WBC/hpf   RBC / HPF 0-5 0 - 5 RBC/hpf   Bacteria, UA MANY (A) NONE SEEN   Urine-Other MUCOUS PRESENT     D/c home with instructions to watch dietary intake of gas producing foods, increase fluids. Keep scheduled appt at  [facility] 02/04/16. FKCs discussed.  Nigel Bridgeman, CNM 01/23/16 2:51p

## 2016-01-23 NOTE — Discharge Instructions (Signed)
Watch intake of gas producing foods Make sure you are drinking lots of water. Call for any questions or concerns.

## 2016-01-23 NOTE — MAU Note (Signed)
Pt reports having abd pain in her mid upper abd. Not hurting currently.Stated she drank some ginger ale and burped some and felt better but she was worried and wanted to get checked out.  Denies vaginal bleeding or discharge.

## 2016-03-19 LAB — OB RESULTS CONSOLE GBS: GBS: POSITIVE

## 2016-03-31 ENCOUNTER — Encounter (HOSPITAL_COMMUNITY): Payer: Self-pay | Admitting: *Deleted

## 2016-03-31 ENCOUNTER — Inpatient Hospital Stay (HOSPITAL_COMMUNITY)
Admission: AD | Admit: 2016-03-31 | Discharge: 2016-04-03 | DRG: 775 | Disposition: A | Discharge status: Home / Self Care | Payer: Medicaid Other | Source: Ambulatory Visit | Attending: Obstetrics and Gynecology | Admitting: Obstetrics and Gynecology

## 2016-03-31 DIAGNOSIS — Z3A39 39 weeks gestation of pregnancy: Secondary | ICD-10-CM | POA: Diagnosis not present

## 2016-03-31 DIAGNOSIS — Z9109 Other allergy status, other than to drugs and biological substances: Secondary | ICD-10-CM

## 2016-03-31 DIAGNOSIS — O9952 Diseases of the respiratory system complicating childbirth: Secondary | ICD-10-CM | POA: Diagnosis present

## 2016-03-31 DIAGNOSIS — Z833 Family history of diabetes mellitus: Secondary | ICD-10-CM | POA: Diagnosis not present

## 2016-03-31 DIAGNOSIS — J301 Allergic rhinitis due to pollen: Secondary | ICD-10-CM | POA: Diagnosis present

## 2016-03-31 DIAGNOSIS — Z6841 Body Mass Index (BMI) 40.0 and over, adult: Secondary | ICD-10-CM

## 2016-03-31 DIAGNOSIS — O99824 Streptococcus B carrier state complicating childbirth: Secondary | ICD-10-CM | POA: Diagnosis present

## 2016-03-31 DIAGNOSIS — O1414 Severe pre-eclampsia complicating childbirth: Secondary | ICD-10-CM | POA: Diagnosis present

## 2016-03-31 DIAGNOSIS — B951 Streptococcus, group B, as the cause of diseases classified elsewhere: Secondary | ICD-10-CM

## 2016-03-31 DIAGNOSIS — O99214 Obesity complicating childbirth: Secondary | ICD-10-CM | POA: Diagnosis present

## 2016-03-31 DIAGNOSIS — O1400 Mild to moderate pre-eclampsia, unspecified trimester: Secondary | ICD-10-CM | POA: Diagnosis present

## 2016-03-31 HISTORY — DX: Other allergy status, other than to drugs and biological substances: Z91.09

## 2016-03-31 LAB — COMPREHENSIVE METABOLIC PANEL
ALBUMIN: 2.5 g/dL — AB (ref 3.5–5.0)
ALT: 8 U/L — AB (ref 14–54)
AST: 15 U/L (ref 15–41)
Alkaline Phosphatase: 70 U/L (ref 38–126)
Anion gap: 4 — ABNORMAL LOW (ref 5–15)
BUN: 8 mg/dL (ref 6–20)
CHLORIDE: 109 mmol/L (ref 101–111)
CO2: 23 mmol/L (ref 22–32)
CREATININE: 0.75 mg/dL (ref 0.44–1.00)
Calcium: 9.2 mg/dL (ref 8.9–10.3)
GFR calc Af Amer: >60 mL/min (ref >60)
GFR calc non Af Amer: >60 mL/min (ref >60)
GLUCOSE: 103 mg/dL — AB (ref 65–99)
POTASSIUM: 4.6 mmol/L (ref 3.5–5.1)
SODIUM: 136 mmol/L (ref 135–145)
Total Bilirubin: 0.5 mg/dL (ref 0.3–1.2)
Total Protein: 6.6 g/dL (ref 6.5–8.1)

## 2016-03-31 LAB — PROTEIN / CREATININE RATIO, URINE
Creatinine, Urine: 160 mg/dL
PROTEIN CREATININE RATIO: 1.91 mg/mg{creat} — AB (ref 0.00–0.15)
Total Protein, Urine: 306 mg/dL

## 2016-03-31 LAB — CBC
HCT: 33.8 % — ABNORMAL LOW (ref 36.0–46.0)
Hemoglobin: 11 g/dL — ABNORMAL LOW (ref 12.0–15.0)
MCH: 27.3 pg (ref 26.0–34.0)
MCHC: 32.5 g/dL (ref 30.0–36.0)
MCV: 83.9 fL (ref 78.0–100.0)
PLATELETS: 248 10*3/uL (ref 150–400)
RBC: 4.03 MIL/uL (ref 3.87–5.11)
RDW: 13.8 % (ref 11.5–15.5)
WBC: 8.5 10*3/uL (ref 4.0–10.5)

## 2016-03-31 LAB — LACTATE DEHYDROGENASE: LDH: 185 U/L (ref 98–192)

## 2016-03-31 LAB — URIC ACID: URIC ACID, SERUM: 4.3 mg/dL (ref 2.3–6.6)

## 2016-03-31 MED ORDER — MISOPROSTOL 25 MCG QUARTER TABLET
25.0000 ug | ORAL_TABLET | ORAL | Status: DC | PRN
  Filled 2016-03-31: qty 0.25

## 2016-03-31 MED ORDER — ONDANSETRON HCL 4 MG/2ML IJ SOLN: 4.0000 mg | Freq: Four times a day (QID) | INTRAMUSCULAR | Status: DC | PRN

## 2016-03-31 MED ORDER — LACTATED RINGERS IV SOLN
500.0000 mL | INTRAVENOUS | Status: DC | PRN
  Administered 2016-04-01: 500 mL via INTRAVENOUS

## 2016-03-31 MED ORDER — LACTATED RINGERS IV SOLN
INTRAVENOUS | Status: DC
  Administered 2016-04-01: 01:00:00 via INTRAVENOUS

## 2016-03-31 MED ORDER — FLEET ENEMA 7-19 GM/118ML RE ENEM: 1.0000 | ENEMA | RECTAL | Status: DC | PRN

## 2016-03-31 MED ORDER — CITRIC ACID-SODIUM CITRATE 334-500 MG/5ML PO SOLN: 30.0000 mL | ORAL | Status: DC | PRN

## 2016-03-31 MED ORDER — OXYTOCIN 10 UNIT/ML IJ SOLN
1.0000 m[IU]/min | INTRAVENOUS | Status: DC
  Administered 2016-04-01: 2 m[IU]/min via INTRAVENOUS
  Filled 2016-03-31: qty 4

## 2016-03-31 MED ORDER — OXYTOCIN BOLUS FROM INFUSION: 500.0000 mL | INTRAVENOUS | Status: DC

## 2016-03-31 MED ORDER — OXYTOCIN 10 UNIT/ML IJ SOLN: 2.5000 [IU]/h | INTRAVENOUS | Status: DC

## 2016-03-31 MED ORDER — MAGNESIUM SULFATE BOLUS VIA INFUSION
4.0000 g | Freq: Once | INTRAVENOUS | Status: DC
  Filled 2016-03-31: qty 500

## 2016-03-31 MED ORDER — OXYCODONE-ACETAMINOPHEN 5-325 MG PO TABS: 2.0000 | ORAL_TABLET | ORAL | Status: DC | PRN

## 2016-03-31 MED ORDER — LACTATED RINGERS IV SOLN: 500.0000 mL | INTRAVENOUS | Status: DC | PRN

## 2016-03-31 MED ORDER — PENICILLIN G POTASSIUM 5000000 UNITS IJ SOLR
5.0000 10*6.[IU] | Freq: Once | INTRAVENOUS | Status: AC
  Administered 2016-04-01: 5 10*6.[IU] via INTRAVENOUS
  Filled 2016-03-31: qty 5

## 2016-03-31 MED ORDER — ACETAMINOPHEN 325 MG PO TABS: 650.0000 mg | ORAL_TABLET | ORAL | Status: DC | PRN

## 2016-03-31 MED ORDER — OXYCODONE-ACETAMINOPHEN 5-325 MG PO TABS: 1.0000 | ORAL_TABLET | ORAL | Status: DC | PRN

## 2016-03-31 MED ORDER — PENICILLIN G POTASSIUM 5000000 UNITS IJ SOLR
2.5000 10*6.[IU] | INTRAVENOUS | Status: DC
  Administered 2016-04-01: 2.5 10*6.[IU] via INTRAVENOUS
  Filled 2016-03-31 (×3): qty 2.5

## 2016-03-31 MED ORDER — LIDOCAINE HCL (PF) 1 % IJ SOLN: 30.0000 mL | INTRAMUSCULAR | Status: DC | PRN

## 2016-03-31 MED ORDER — LACTATED RINGERS IV SOLN
2.0000 g/h | INTRAVENOUS | Status: DC
  Administered 2016-04-02: 2 g/h via INTRAVENOUS
  Filled 2016-03-31 (×3): qty 80

## 2016-03-31 MED ORDER — NALBUPHINE HCL 10 MG/ML IJ SOLN
10.0000 mg | INTRAMUSCULAR | Status: DC | PRN
  Administered 2016-04-01 (×2): 10 mg via INTRAVENOUS
  Filled 2016-03-31 (×2): qty 1

## 2016-03-31 MED ORDER — LABETALOL HCL 5 MG/ML IV SOLN: 20.0000 mg | INTRAVENOUS | Status: DC | PRN

## 2016-03-31 MED ORDER — HYDRALAZINE HCL 20 MG/ML IJ SOLN
10.0000 mg | Freq: Once | INTRAMUSCULAR | Status: AC | PRN
  Administered 2016-04-01: 10 mg via INTRAVENOUS
  Filled 2016-03-31: qty 1

## 2016-03-31 MED ORDER — TERBUTALINE SULFATE 1 MG/ML IJ SOLN: 0.2500 mg | Freq: Once | INTRAMUSCULAR | Status: DC | PRN

## 2016-03-31 MED ORDER — LACTATED RINGERS IV SOLN: INTRAVENOUS | Status: DC

## 2016-03-31 NOTE — OB Triage Note (Signed)
Pt reports uc's every 5 min x 30 min.  No leaking of fluid.

## 2016-04-01 ENCOUNTER — Encounter (HOSPITAL_COMMUNITY): Payer: Self-pay | Admitting: Anesthesiology

## 2016-04-01 ENCOUNTER — Encounter (HOSPITAL_COMMUNITY): Payer: Self-pay | Admitting: *Deleted

## 2016-04-01 LAB — ABO/RH: ABO/RH(D): A POS

## 2016-04-01 LAB — TYPE AND SCREEN
ABO/RH(D): A POS
Antibody Screen: NEGATIVE

## 2016-04-01 LAB — RPR: RPR: NONREACTIVE

## 2016-04-01 MED ORDER — DIPHENHYDRAMINE HCL 25 MG PO CAPS: 25.0000 mg | ORAL_CAPSULE | Freq: Four times a day (QID) | ORAL | Status: DC | PRN

## 2016-04-01 MED ORDER — OXYCODONE-ACETAMINOPHEN 5-325 MG PO TABS: 2.0000 | ORAL_TABLET | ORAL | Status: DC | PRN

## 2016-04-01 MED ORDER — LACTATED RINGERS IV SOLN
INTRAVENOUS | Status: DC
  Administered 2016-04-01 – 2016-04-02 (×3): via INTRAVENOUS

## 2016-04-01 MED ORDER — SIMETHICONE 80 MG PO CHEW: 80.0000 mg | CHEWABLE_TABLET | ORAL | Status: DC | PRN

## 2016-04-01 MED ORDER — DIBUCAINE 1 % RE OINT
1.0000 "application " | TOPICAL_OINTMENT | RECTAL | Status: DC | PRN
  Filled 2016-04-01: qty 28

## 2016-04-01 MED ORDER — BENZOCAINE-MENTHOL 20-0.5 % EX AERO
1.0000 "application " | INHALATION_SPRAY | CUTANEOUS | Status: DC | PRN
  Administered 2016-04-01: 1 via TOPICAL
  Filled 2016-04-01 (×2): qty 56

## 2016-04-01 MED ORDER — ZOLPIDEM TARTRATE 5 MG PO TABS: 5.0000 mg | ORAL_TABLET | Freq: Every evening | ORAL | Status: DC | PRN

## 2016-04-01 MED ORDER — WITCH HAZEL-GLYCERIN EX PADS: 1.0000 "application " | MEDICATED_PAD | CUTANEOUS | Status: DC | PRN

## 2016-04-01 MED ORDER — IBUPROFEN 600 MG PO TABS
600.0000 mg | ORAL_TABLET | Freq: Four times a day (QID) | ORAL | Status: DC
  Administered 2016-04-01 – 2016-04-03 (×9): 600 mg via ORAL
  Filled 2016-04-01 (×9): qty 1

## 2016-04-01 MED ORDER — LABETALOL HCL 5 MG/ML IV SOLN: 20.0000 mg | INTRAVENOUS | Status: DC | PRN

## 2016-04-01 MED ORDER — ACETAMINOPHEN 325 MG PO TABS: 650.0000 mg | ORAL_TABLET | ORAL | Status: DC | PRN

## 2016-04-01 MED ORDER — HYDRALAZINE HCL 20 MG/ML IJ SOLN: 10.0000 mg | Freq: Once | INTRAMUSCULAR | Status: DC | PRN

## 2016-04-01 MED ORDER — OXYCODONE-ACETAMINOPHEN 5-325 MG PO TABS: 1.0000 | ORAL_TABLET | ORAL | Status: DC | PRN

## 2016-04-01 MED ORDER — ONDANSETRON HCL 4 MG PO TABS: 4.0000 mg | ORAL_TABLET | ORAL | Status: DC | PRN

## 2016-04-01 MED ORDER — PRENATAL MULTIVITAMIN CH
1.0000 | ORAL_TABLET | Freq: Every day | ORAL | Status: DC
  Administered 2016-04-01 – 2016-04-03 (×3): 1 via ORAL
  Filled 2016-04-01 (×3): qty 1

## 2016-04-01 MED ORDER — OXYTOCIN 10 UNIT/ML IJ SOLN: 2.5000 [IU]/h | INTRAVENOUS | Status: DC

## 2016-04-01 MED ORDER — FERROUS SULFATE 325 (65 FE) MG PO TABS
325.0000 mg | ORAL_TABLET | Freq: Two times a day (BID) | ORAL | Status: DC
  Administered 2016-04-01 – 2016-04-03 (×5): 325 mg via ORAL
  Filled 2016-04-01 (×6): qty 1

## 2016-04-01 MED ORDER — LACTATED RINGERS IV SOLN
INTRAVENOUS | Status: DC
  Administered 2016-04-01: 05:00:00 via INTRAUTERINE

## 2016-04-01 MED ORDER — MAGNESIUM SULFATE BOLUS VIA INFUSION
4.0000 g | Freq: Once | INTRAVENOUS | Status: AC
  Administered 2016-04-01: 4 g via INTRAVENOUS
  Filled 2016-04-01: qty 500

## 2016-04-01 MED ORDER — COCONUT OIL OIL
1.0000 "application " | TOPICAL_OIL | Status: DC | PRN
  Filled 2016-04-01: qty 120

## 2016-04-01 MED ORDER — SENNOSIDES-DOCUSATE SODIUM 8.6-50 MG PO TABS
2.0000 | ORAL_TABLET | ORAL | Status: DC
  Administered 2016-04-01 – 2016-04-03 (×2): 2 via ORAL
  Filled 2016-04-01 (×2): qty 2

## 2016-04-01 MED ORDER — LIDOCAINE HCL (PF) 1 % IJ SOLN
INTRAMUSCULAR | Status: AC
  Administered 2016-04-01: 30 mL
  Filled 2016-04-01: qty 30

## 2016-04-01 MED ORDER — ONDANSETRON HCL 4 MG/2ML IJ SOLN: 4.0000 mg | INTRAMUSCULAR | Status: DC | PRN

## 2016-04-01 MED ORDER — ONDANSETRON HCL 4 MG/2ML IJ SOLN
4.0000 mg | Freq: Three times a day (TID) | INTRAMUSCULAR | Status: DC | PRN
  Administered 2016-04-01: 4 mg via INTRAVENOUS
  Filled 2016-04-01: qty 2

## 2016-04-01 NOTE — Progress Notes (Signed)
Family at bedside. Pt's mom states this will be a "natural" birth -- no epidural, only IV pain medication. Pt seems to be in agreement. No input from FOB.   Subjective: Notes relief w/ IV Nubain. Able to sleep between ctxs. Denies h/a, visual changes, epigastric pain or difficulty breathing.  Objective: BP 134/81 mmHg  Pulse 80  Temp(Src) 97.8 F (36.6 C) (Oral)  Resp 18  Ht 5\' 4"  (1.626 m)  Wt 126.1 kg (278 lb)  BMI 47.70 kg/m2  LMP 07/07/2015   Total I/O In: -  Out: 350 [Emesis/NG output:350]  Today's Vitals   04/01/16 0057 04/01/16 0153 04/01/16 0155 04/01/16 0234  BP:   134/81   Pulse:   80   Temp:  97.7 F (36.5 C)  97.8 F (36.6 C)  TempSrc:  Axillary  Oral  Resp:   18   Height:      Weight:      PainSc: 10-Worst pain ever     BPs noted to be 151/113 @ 2242                            159/94 @ 2249                            174/109 @ 2303                            159/105 @ 2357                            153/108 @ 0003  Was given one dose of anti-hypertensive therapy at 0002 (Hydralazine 10 mg)  FHT: BL 135 w/ moderate variability, +accels, earlys, no variables or lates. Some sleep cycles noted UC:   irregular, every 2-3 minutes SVE:   Dilation: 4 Effacement (%): 70 Station: -2 Exam by:: kim Flora Ratz,cnm @ 0220 AROM'd, mod mec @ 0217 FSE & IUPC placed at 0225 Pitocin at 2 mU/min @ 0246  Assessment:  IOL due to preE with severe features Latent labor Moderate meconium Cat 1 FHRT GBS positive - received first dose at 0101  Plan: Titrate Pitocin to MVUs of 200 500 ml bolus now Expect progress and SVD  Sherre ScarletWILLIAMS, Ramiyah Mcclenahan CNM 04/01/2016, 2:44 AM

## 2016-04-01 NOTE — Lactation Note (Signed)
This note was copied from a baby's chart. Lactation Consultation Note  Initial visit made.  Breastfeeding consultation services and support information given and reviewed.  Baby is 10 hours old and has been to the breast 2-3 times.  Baby just fed one hour ago.  Mom states she is having some difficulty with latch on left side.  Instructed to feed baby with any feeding cue and to call for assist prn.  Discussed colostrum and milk coming to volume.  Mom asking if she should pump to see how much milk she has.  Explained that pumping is not a good indicator of what a baby can transfer from breast.  First 24 hours and cluster feeding on day 2-3 reviewed.  Encouraged to call with concerns/assist prn.  Patient Name: Amy Kevin FentonSierra Dara ZOXWR'UToday's Date: 04/01/2016 Reason for consult: Initial assessment   Maternal Data    Feeding    LATCH Score/Interventions                      Lactation Tools Discussed/Used     Consult Status Consult Status: Follow-up Date: 04/02/16 Follow-up type: In-patient    Huston FoleyMOULDEN, Jacqulynn Shappell S 04/01/2016, 4:07 PM

## 2016-04-01 NOTE — Progress Notes (Signed)
Medium size cuff initiated at time of pitocin started for patient's comfort. BP inaccurate with smaller cuff, Large adult cuff placed at 0400. K.Mayford KnifeWilliams, CNM aware of inaccurate BP with smaller cuff.

## 2016-04-01 NOTE — H&P (Signed)
Amy Fitzgerald is a 25 y.o. female, G1P0 at 39.1 weeks, presenting for irregular UCs since 03/30/16. Endorses FM. Denies VB, but admits to 2 episodes of spotting w/ wiping today. Denies LOF, h/a, visual changes, epigastric pain or difficulty breathing.   Patient Active Problem List   Diagnosis Date Noted  . Severe preeclampsia 03/31/2016  . Positive GBS test 03/31/2016  . 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 Morbid obesity (HCC) 01/23/2016    History of present pregnancy: Patient entered care at 9.1 weeks.   EDC of 04/06/16 was established by u/s at 10.3 wks .   Anatomy scan: 20 weeks, with normal findings and a posterior placenta.   Additional [place] evaluations: 12.1 wks: (First trimester screen): TA images. Singleton pregnancy. Anteverted uterus. Placenta appears posterior fundal. NT = 1.8 mm, amnion seen. Ovaries and adnexas unremarkable. 33.4 wks: (Growth and BPP). Singleton pregnancy. Vertex presentation. Cvx measures 4.7 cm. Posterior placenta. AFI = 40th%. EFW 2588/5+11/82%. BPP 8/8 in 5 min. Ovaries and adnexas unremarkable. 34.2 wks: (BPP). 8/8 in 7 min. Vertex presentation. Posterior placenta. AFI 45%.  36.0 wks: (BPP). Singleton pregnancy. Vertex presentation. Anterior placenta. Normal fluid. AFI 13.4 cm c/w 45%, cervix not seen per protocol. BPP 8/8 in 20 min, adnexas and ovaries unremarkable. 37.2 wks: (BPP). 8/8.  38.3 wks: (Growth and BPP). Singleton pregnancy. Vertex presentation. Cervix not seen per protocol. Posterior placenta. AFI = 35th%. EFW = 7lb 12 oz (3518g) 82%. BPP 8/8 in 15 minutes. Adnexas unremarkable.  Significant prenatal events: Unplanned pregnancy yet desired. FOB involved. Declined both flu and Tdap. Normal pregnancy discomforts - relief measures reviewed. BMI - 45+; BPPs beginning at 32 wks, then weekly thereafter. Seen in MAU for upper abdominal pain at home, relieved by burp. NST not classically reactive, but reassuring. BPP 8/8, with AFI 12.51, 33%ile, vtx. UA negative.  Home with instructions to avoid gas-producing foods and increase fluids. TWG 19 lbs.      Last evaluation: Office by Dr. Estanislado Pandy at 38.3 wks. BP 126/86 and 118/88 respectively. Wt 280 lbs. 2+ edema, 3+ protein. preE labs completed due to amount of protein. Pt asymptomatic other than edema. Labs normal, but PCR elevated at .565.   OB History    Gravida Para Term Preterm AB TAB SAB Ectopic Multiple Living   1         0     Past Medical History  Diagnosis Date  . Medical history non-contributory    Past Surgical History  Procedure Laterality Date  . No past surgeries     Family History: Significant for DM in her MGM.  Social History:  reports that she has never smoked. She does not have any smokeless tobacco history on file. She reports that she does not drink alcohol or use illicit drugs. Patient is single, with FOB (Deonta Antone) involved and supportive.She is [place] American with no religious preference, unemployed, and high-school educated. She will accept blood in an emergency.    Prenatal Transfer Tool  Maternal Diabetes: No Genetic Screening: Normal first trimester screen Maternal Ultrasounds/Referrals: Normal Fetal Ultrasounds or other Referrals:  None Maternal Substance Abuse:  No Significant Maternal Medications:  Meds include: Other: PNV, Ferrous Sulfate 325 mg (65 mg iron) daily Significant Maternal Lab Results: Lab values include: Group B Strep positive  TDAP: Declined Flu: Declined  ROS:10 Systems reviewed and are negative for acute change except as noted in the HPI.   No Known Allergies   Blood pressure 133/70, pulse 80, temperature 98 F (  36.7 C), temperature source Oral, resp. rate 20, height  (1.626 m), weight 126.1 kg (278 lb), last menstrual period 07/07/2015.  Chest clear Heart RRR without murmur Abd gravid, NT, FH CWD Pelvic: 1/50/-2 per RN at 1900 Bishop score: 4 EFW: 8 lbs/adequate for trial of labor  Cephalic by Leopolds and VE Ext: 1+  DTRs, no clonus, 1+ edema bilaterally  FHR: BL 135 w/ moderate variability, +accels, no decels UCs: Irregular, palpate mild  Prenatal labs: ABO, Rh: A/Positive/-- (09/27 0000) Antibody: Negative (09/27 0000) Rubella: Immune RPR: Nonreactive (09/27 0000)  HBsAg: Negative (09/27 0000)  HIV: Non-reactive (09/27 0000)  GBS: Positive (04/13 0000) Sickle cell/Hgb electrophoresis: Normal study Pap: Neg (09/12/15) GC: Neg (09/03/15) Chlamydia: Neg (09/03/15) Genetic screenings: Normal first trimester screen Glucola: Normal at 96  Hgb 11.1 at NOB, 9.9 at 28 weeks, 11.1 on 03/26/16  Results for orders placed or performed during the hospital encounter of 03/31/16 (from the past 24 hour(s))  CBC     Status: Abnormal   Collection Time: 03/31/16  7:50 PM  Result Value Ref Range   WBC 8.5 4.0 - 10.5 K/uL   RBC 4.03 3.87 - 5.11 MIL/uL   Hemoglobin 11.0 (L) 12.0 - 15.0 g/dL   HCT 16.1 (L) 09.6 - 04.5 %   MCV 83.9 78.0 - 100.0 fL   MCH 27.3 26.0 - 34.0 pg   MCHC 32.5 30.0 - 36.0 g/dL   RDW 40.9 81.1 - 91.4 %   Platelets 248 150 - 400 K/uL  Comprehensive metabolic panel     Status: Abnormal   Collection Time: 03/31/16  7:50 PM  Result Value Ref Range   Sodium 136 135 - 145 mmol/L   Potassium 4.6 3.5 - 5.1 mmol/L   Chloride 109 101 - 111 mmol/L   CO2 23 22 - 32 mmol/L   Glucose, Bld 103 (H) 65 - 99 mg/dL   BUN 8 6 - 20 mg/dL   Creatinine, Ser 7.82 0.44 - 1.00 mg/dL   Calcium 9.2 8.9 - 95.6 mg/dL   Total Protein 6.6 6.5 - 8.1 g/dL   Albumin 2.5 (L) 3.5 - 5.0 g/dL   AST 15 15 - 41 U/L   ALT 8 (L) 14 - 54 U/L   Alkaline Phosphatase 70 38 - 126 U/L   Total Bilirubin 0.5 0.3 - 1.2 mg/dL   GFR calc non Af Amer >60 >60 mL/min   GFR calc Af Amer >60 >60 mL/min   Anion gap 4 (L) 5 - 15  Lactate dehydrogenase     Status: None   Collection Time: 03/31/16  7:50 PM  Result Value Ref Range   LDH 185 98 - 192 U/L  Uric acid     Status: None   Collection Time: 03/31/16  7:50 PM  Result Value Ref  Range   Uric Acid, Serum 4.3 2.3 - 6.6 mg/dL  Protein / creatinine ratio, urine     Status: Abnormal   Collection Time: 03/31/16  7:55 PM  Result Value Ref Range   Creatinine, Urine 160.00 mg/dL   Total Protein, Urine 306 mg/dL   Protein Creatinine Ratio 1.91 (H) 0.00 - 0.15 mg/mg[Cre]  Type and screen [facility]     Status: None   Collection Time: 04/01/16 12:15 AM  Result Value Ref Range   ABO/RH(D) A POS    Antibody Screen NEG    Sample Expiration 04/04/2016     Assessment: IUP at 39.1 wks IOL due to preE  w/ severe features (BPs 130s-150s/80s-102; PCR 1.91) Latent labor FWB: Cat 1 GBS positive Unfavorable cvx Morbid obesity  [facility]: [facility] consult with Dr. Estanislado Pandyivard. Routine [facility] orders. Pain med/epidural prn. IV antihypertensive for severe range pressures. Reviewed plan of care with patient, FOB and pt's mother, including rationale and processes of induction, to include Cytotec, foley bulb, pitocin and AROM. Risks and benefits of induction were reviewed, including failure of method, prolonged labor, need for further intervention, and risk of cesarean.I furthered reviewed hypertension as a cause of uteroplacental insufficiency, w/ increased risk of IUGR, oligohydramnios, and stillbirth. We also discussed placental abruption in the setting of severe preE. Patient and family seem to understand these risks and wish to proceed. In light of rare ctxs, will place Cytotec (max dose 3 per Dr. Estanislado Pandyivard).  PCN G for GBS prophylaxis per standard dosing with ROM or active labor. Per Dr. Estanislado Pandyivard, will begin Magnesium Sulfate when in active labor. Explained in detail. Consult as indicated. Expect progress and SVD.  Sherre ScarletKimberly Moselle Rister, CNM 03/31/16, 09:40 PM

## 2016-04-01 NOTE — Progress Notes (Signed)
  Subjective: Desires more pain medication. Feeling the urge to push. Denies h/a, visual changes, epigastric pain or difficulty breathing.    Objective: BP 124/69 mmHg  Pulse 75  Temp(Src) 97.8 F (36.6 C) (Oral)  Resp 16  Ht 5\' 4"  (1.626 m)  Wt 126.1 kg (278 lb)  BMI 47.70 kg/m2  LMP 07/07/2015   Total I/O In: -  Out: 350 [Emesis/NG output:350] Today's Vitals   04/01/16 0303 04/01/16 0335 04/01/16 0400 04/01/16 0405  BP: 164/103 178/102 138/78 124/69  Pulse: 72 70 72 75  Temp:      TempSrc:      Resp: 16     Height:      Weight:      PainSc:       BPs at 0303 and 0335 incorrect due to wrong sized cuff. Correct cuff placed.  FHT: BL 125 w/ moderate variability, +accels, +earlys, mild, non-repetitive variables, no lates UC:   irregular, every 1-2 minutes, MVUs 130-195 SVE:   Dilation: 4 Effacement (%): 90 Station: -2 Exam by:: kim Aayansh Codispoti, cnm @ 0405 Pitocin at 2 mU/min  Assessment:  IOL due to preE w/ severe features Latent labor GBS positive Overall reassuring FHRT. Urge to push likely from head compression Inadequate MVUs  Plan: Begin Amnioinfusion. Continue to increase Pitocin . Continue intrauterine resuscitative measures as needed. Magnesium Sulfate when in active labor.  Sherre ScarletWILLIAMS, Roberth Berling CNM 04/01/2016, 4:11 AM

## 2016-04-02 LAB — COMPREHENSIVE METABOLIC PANEL
ALBUMIN: 2.4 g/dL — AB (ref 3.5–5.0)
ALK PHOS: 69 U/L (ref 38–126)
ALT: 11 U/L — ABNORMAL LOW (ref 14–54)
AST: 28 U/L (ref 15–41)
Anion gap: 5 (ref 5–15)
BILIRUBIN TOTAL: 0.4 mg/dL (ref 0.3–1.2)
CO2: 22 mmol/L (ref 22–32)
Calcium: 8.3 mg/dL — ABNORMAL LOW (ref 8.9–10.3)
Chloride: 108 mmol/L (ref 101–111)
Creatinine, Ser: 0.71 mg/dL (ref 0.44–1.00)
GFR calc Af Amer: >60 mL/min (ref >60)
GFR calc non Af Amer: >60 mL/min (ref >60)
GLUCOSE: 98 mg/dL (ref 65–99)
POTASSIUM: 4.1 mmol/L (ref 3.5–5.1)
Sodium: 135 mmol/L (ref 135–145)
TOTAL PROTEIN: 6 g/dL — AB (ref 6.5–8.1)

## 2016-04-02 LAB — CBC
HEMATOCRIT: 34.7 % — AB (ref 36.0–46.0)
HEMOGLOBIN: 11.4 g/dL — AB (ref 12.0–15.0)
MCH: 27.3 pg (ref 26.0–34.0)
MCHC: 32.9 g/dL (ref 30.0–36.0)
MCV: 83 fL (ref 78.0–100.0)
Platelets: 234 10*3/uL (ref 150–400)
RBC: 4.18 MIL/uL (ref 3.87–5.11)
RDW: 14 % (ref 11.5–15.5)
WBC: 16.3 10*3/uL — AB (ref 4.0–10.5)

## 2016-04-02 LAB — LACTATE DEHYDROGENASE: LDH: 306 U/L — ABNORMAL HIGH (ref 98–192)

## 2016-04-02 LAB — URIC ACID: Uric Acid, Serum: 4.6 mg/dL (ref 2.3–6.6)

## 2016-04-02 LAB — MAGNESIUM: Magnesium: 4.5 mg/dL — ABNORMAL HIGH (ref 1.7–2.4)

## 2016-04-02 NOTE — Progress Notes (Signed)
Amy Fitzgerald  Post Partum Day 1:S/P SVD with repair of 2nd Degree Vaginal and Bilateral Inner Labial Lacerations  Subjective: Patient up ad lib, denies syncope or dizziness. Further denies HA, visual disturbances, SOB, and epigastric pain. Reports mild edema consuming regular diet without issues and denies N/V. Denies issues with urination and reports bleeding is "slowing down."  Patient is breastfeeding and infant at breast without issues.  Desires for postpartum contraception discussed, but patient unsure.  Pain is being appropriately managed with use of motrin.  Objective: Filed Vitals:   04/01/16 2140 04/01/16 2346 04/02/16 0322 04/02/16 0810  BP:  146/97 145/88 134/95  Pulse:  89 89 85  Temp:  98.1 F (36.7 C) 98.3 F (36.8 C) 98.2 F (36.8 C)  TempSrc:  Oral Oral Oral  Resp: 18 18 18 17   Height:      Weight:      SpO2:  100% 100%     Results for orders placed or performed during the hospital encounter of 03/31/16 (from the past 24 hour(s))  CBC     Status: Abnormal   Collection Time: 04/02/16  5:54 AM  Result Value Ref Range   WBC 16.3 (H) 4.0 - 10.5 K/uL   RBC 4.18 3.87 - 5.11 MIL/uL   Hemoglobin 11.4 (L) 12.0 - 15.0 g/dL   HCT 16.134.7 (L) 09.636.0 - 04.546.0 %   MCV 83.0 78.0 - 100.0 fL   MCH 27.3 26.0 - 34.0 pg   MCHC 32.9 30.0 - 36.0 g/dL   RDW 40.914.0 81.111.5 - 91.415.5 %   Platelets 234 150 - 400 K/uL  Comprehensive metabolic panel     Status: Abnormal   Collection Time: 04/02/16  5:54 AM  Result Value Ref Range   Sodium 135 135 - 145 mmol/L   Potassium 4.1 3.5 - 5.1 mmol/L   Chloride 108 101 - 111 mmol/L   CO2 22 22 - 32 mmol/L   Glucose, Bld 98 65 - 99 mg/dL   BUN <5 (L) 6 - 20 mg/dL   Creatinine, Ser 7.820.71 0.44 - 1.00 mg/dL   Calcium 8.3 (L) 8.9 - 10.3 mg/dL   Total Protein 6.0 (L) 6.5 - 8.1 g/dL   Albumin 2.4 (L) 3.5 - 5.0 g/dL   AST 28 15 - 41 U/L   ALT 11 (L) 14 - 54 U/L   Alkaline Phosphatase 69 38 - 126 U/L   Total Bilirubin 0.4 0.3 - 1.2 mg/dL   GFR calc non Af Amer  >60 >60 mL/min   GFR calc Af Amer >60 >60 mL/min   Anion gap 5 5 - 15  Lactate dehydrogenase     Status: Abnormal   Collection Time: 04/02/16  5:54 AM  Result Value Ref Range   LDH 306 (H) 98 - 192 U/L  Uric acid     Status: None   Collection Time: 04/02/16  5:54 AM  Result Value Ref Range   Uric Acid, Serum 4.6 2.3 - 6.6 mg/dL  Magnesium     Status: Abnormal   Collection Time: 04/02/16  5:54 AM  Result Value Ref Range   Magnesium 4.5 (H) 1.7 - 2.4 mg/dL    Physical Exam:  General: alert, cooperative and no distress Mood/Affect: Appropriate/Bright Lungs: clear to auscultation, no wheezes, rales or rhonchi, symmetric air entry.  Heart: normal rate and regular rhythm. Breast: breasts appear normal, no suspicious masses, no skin or nipple changes or axillary nodes. Abdomen:  + bowel sounds, Soft, NT Uterine Fundus: firm, U/-1  Lochia: appropriate Laceration: Not Examined Skin: Warm, Dry DVT Evaluation: No evidence of DVT seen on physical exam. No cords or calf tenderness. Calf/Ankle edema is present.  Assessment S/P Vaginal Delivery-Day 1 Normal Involution BreastFeeding Severe PreEclampsia MgSO4 Infusion  Plan:  D/C MgSo4 at 0900 May transfer to [facility] after adequate time s/p mag Continue IV antihypertensives as appropriate Continue other care as ordered Dr. Lynford Humphrey to be updated on patient status   Cherre Robins, MSN, CNM 04/02/2016, 8:49 AM

## 2016-04-02 NOTE — Lactation Note (Signed)
This note was copied from a baby's chart. Lactation Consultation Note  Patient Name: Girl Kevin FentonSierra Kackley ZOXWR'UToday's Date: 04/02/2016 Reason for consult: Follow-up assessment;Difficult latch   Follow up with mom of 29 hour old infant in Antenatal. Infant with 8 BF for 15-45 minutes, 4 voids and 4 stools in last 24 hours. LATCH Scores 7-9 by bedside RN's. Infant weight 7 lb 12.7 oz with 3% weight loss since birth. Mom has been on MgSo4, which has been d/c'd. Infant with MSF at birth. Infant skin jaundiced appearing on exam.  Mom reports she has not been able to get infant latched to left breast, she has tried twice since birth, infant latches for mom to right breast, mom reports there is pain throughout feedings. Mom with large pendulous breasts and large everted nipples. Nipple tissue is intact. Right nipple easily compressible, left nipple thick and difficult to compress, nipple softened some with massage.   Infant cueing to feed, assisted mom in latching infant to left breast. Infant did not latch easily and would not stay latched, probably due to size and non compressability of nipple tissue. Infant latched to right side in football hold. She latched easily with rhythmic suckling and few intermittent swallows noted. Mom was giving infant breathing space and pulling nipple out of mouth at which time she noted pain with feeding, nipple was noted to be misshapen when we un-latched infant. Showed mom how to latch infant with out needing breathing space and she noted pain was not evident. Nipple was round when infant came off after 15 minutes.   Mom has a DEBP at bedside, enc her to pump left breast if infant not able to feed on it. We hand expressed and no colostrum was noted to left side. Mom is using # 27 flanges for pumping. Taught mom how to do reverse pressure to left breast to decrease edema, gave her inverted nipple shells to assist with fluid mobilization between feeds with instructions for cleaning and  use. Enc mom to offer left breast with each feeding and to perform reverse pressure and hand expression before latching infant.    [facility] Enc mom to call out to nurses station for assistance as needed. Will follow up tomorrow. Mom reports she has a single electric breast pump at home for use. Will reevaluate at d/c for need for DEBP.     Maternal Data Has patient been taught Hand Expression?: Yes Does the patient have breastfeeding experience prior to this delivery?: No  Feeding Feeding Type: Breast Fed Length of feed: 15 min  LATCH Score/Interventions Latch: Grasps breast easily, tongue down, lips flanged, rhythmical sucking. Intervention(s): Adjust position;Assist with latch;Breast massage;Breast compression  Audible Swallowing: A few with stimulation Intervention(s): Alternate breast massage  Type of Nipple: Everted at rest and after stimulation  Comfort (Breast/Nipple): Filling, red/small blisters or bruises, mild/mod discomfort  Problem noted: Mild/Moderate discomfort Interventions (Mild/moderate discomfort): Reverse pressue;Post-pump;Breast shields (Inverted Breast shell to left breast for areola edema)  Hold (Positioning): Assistance needed to correctly position infant at breast and maintain latch. Intervention(s): Breastfeeding basics reviewed;Support Pillows;Position options;Skin to skin  LATCH Score: 7  Lactation Tools Discussed/Used WIC Program: Yes Pump Review: Setup, frequency, and cleaning   Consult Status Consult Status: Follow-up Date: 04/03/16 Follow-up type: In-patient    Silas FloodSharon S Hice 04/02/2016, 11:53 AM

## 2016-04-03 MED ORDER — IBUPROFEN 600 MG PO TABS: 600.0000 mg | ORAL_TABLET | Freq: Four times a day (QID) | ORAL | Status: DC

## 2016-04-03 NOTE — Lactation Note (Addendum)
This note was copied from a baby's chart. Lactation Consultation Note  Baby is having a difficult time opening her mouth wide to grasp the breast and pull it deeply in to her mouth.  She has not had a full feeding since 0100.  At this feeding she had about 3 bursts over 20 minutes where long jaw excursions were noted and swallows 2-3 were heard She quickly falls asleep at the breast and needs constant stimulation to engage at all.  Doubt that she is transferring.  It was noted that she had some central retraction with tongue extension.  When a gloved finger is entered deeply into her mouth she is able to maintain suckle.  Hoping that as mom's supply increases she will be able to pull the breast deeper.  Recommend additional help with feedings and pumping every 2-3 hours if baby is not discharged. If baby is discharged mom needs to monitor output carefully, supplement with expressed BM or formula and pump. [facility]  Patient Name: Amy Fitzgerald NUUVO'ZToday's Date: 04/03/2016 Reason for consult: Follow-up assessment   Maternal Data Has patient been taught Hand Expression?: Yes  Feeding Feeding Type: Breast Fed Length of feed: 5 min (few sucks)  LATCH Score/Interventions Latch: Repeated attempts needed to sustain latch, nipple held in mouth throughout feeding, stimulation needed to elicit sucking reflex.  Audible Swallowing: A few with stimulation  Type of Nipple: Everted at rest and after stimulation  Comfort (Breast/Nipple): Soft / non-tender     Hold (Positioning): Assistance needed to correctly position infant at breast and maintain latch.  LATCH Score: 7  Lactation Tools Discussed/Used Tools: Nipple [name]   Consult Status Consult Status: Follow-up Date: 04/03/16    Soyla DryerJoseph, Elvena Oyer 04/03/2016, 1:18 PM

## 2016-04-03 NOTE — Lactation Note (Signed)
This note was copied from a baby's chart. Lactation Consultation Note  Pt is DC. Mom plans to continue working on BF and supplement as needed.  [facility] and will call for OP appointment if she feels that she needs it.  Patient Name: Amy Fitzgerald JXBJY'NToday's Date: 04/03/2016 Reason for consult: Follow-up assessment   Maternal Data Has patient been taught Hand Expression?: Yes  Feeding Feeding Type: Bottle Fed - Formula Length of feed: 5 min (few sucks)  LATCH Score/Interventions Latch: Repeated attempts needed to sustain latch, nipple held in mouth throughout feeding, stimulation needed to elicit sucking reflex.  Audible Swallowing: A few with stimulation  Type of Nipple: Everted at rest and after stimulation  Comfort (Breast/Nipple): Soft / non-tender     Hold (Positioning): Assistance needed to correctly position infant at breast and maintain latch.  LATCH Score: 7  Lactation Tools Discussed/Used Tools: Nipple [name]   Consult Status Consult Status: Follow-up Date: 04/03/16    Amy DryerJoseph, Amy Fitzgerald 04/03/2016, 3:03 PM

## 2016-04-03 NOTE — Discharge Instructions (Signed)
Contraception Choices Contraception (birth control) is the use of any methods or devices to prevent pregnancy. Below are some methods to help avoid pregnancy. HORMONAL METHODS   Contraceptive implant. This is a thin, plastic tube containing progesterone hormone. It does not contain estrogen hormone. Your health care provider inserts the tube in the inner part of the upper arm. The tube can remain in place for up to 3 years. After 3 years, the implant must be removed. The implant prevents the ovaries from releasing an egg (ovulation), thickens the cervical mucus to prevent sperm from entering the uterus, and thins the lining of the inside of the uterus.  Progesterone-only injections. These injections are given every 3 months by your health care provider to prevent pregnancy. This synthetic progesterone hormone stops the ovaries from releasing eggs. It also thickens cervical mucus and changes the uterine lining. This makes it harder for sperm to survive in the uterus.  Birth control pills. These pills contain estrogen and progesterone hormone. They work by preventing the ovaries from releasing eggs (ovulation). They also cause the cervical mucus to thicken, preventing the sperm from entering the uterus. Birth control pills are prescribed by a health care provider.Birth control pills can also be used to treat heavy periods.  Minipill. This type of birth control pill contains only the progesterone hormone. They are taken every day of each month and must be prescribed by your health care provider.  Birth control patch. The patch contains hormones similar to those in birth control pills. It must be changed once a week and is prescribed by a health care provider.  Vaginal ring. The ring contains hormones similar to those in birth control pills. It is left in the vagina for 3 weeks, removed for 1 week, and then a new one is put back in place. The patient must be comfortable inserting and removing the ring  from the vagina.A health care provider's prescription is necessary.  Emergency contraception. Emergency contraceptives prevent pregnancy after unprotected sexual intercourse. This pill can be taken right after sex or up to 5 days after unprotected sex. It is most effective the sooner you take the pills after having sexual intercourse. Most emergency contraceptive pills are available without a prescription. Check with your pharmacist. Do not use emergency contraception as your only form of birth control. BARRIER METHODS   Female condom. This is a thin sheath (latex or rubber) that is worn over the penis during sexual intercourse. It can be used with spermicide to increase effectiveness.  Female condom. This is a soft, loose-fitting sheath that is put into the vagina before sexual intercourse.  Diaphragm. This is a soft, latex, dome-shaped barrier that must be fitted by a health care provider. It is inserted into the vagina, along with a spermicidal jelly. It is inserted before intercourse. The diaphragm should be left in the vagina for 6 to 8 hours after intercourse.  Cervical cap. This is a round, soft, latex or plastic cup that fits over the cervix and must be fitted by a health care provider. The cap can be left in place for up to 48 hours after intercourse.  Sponge. This is a soft, circular piece of polyurethane foam. The sponge has spermicide in it. It is inserted into the vagina after wetting it and before sexual intercourse.  Spermicides. These are chemicals that kill or block sperm from entering the cervix and uterus. They come in the form of creams, jellies, suppositories, foam, or tablets. They do not require a  prescription. They are inserted into the vagina with an applicator before having sexual intercourse. The process must be repeated every time you have sexual intercourse. INTRAUTERINE CONTRACEPTION  Intrauterine device (IUD). This is a T-shaped device that is put in a woman's uterus  during a menstrual period to prevent pregnancy. There are 2 types:  Copper IUD. This type of IUD is wrapped in copper wire and is placed inside the uterus. Copper makes the uterus and fallopian tubes produce a fluid that kills sperm. It can stay in place for 10 years.  Hormone IUD. This type of IUD contains the hormone progestin (synthetic progesterone). The hormone thickens the cervical mucus and prevents sperm from entering the uterus, and it also thins the uterine lining to prevent implantation of a fertilized egg. The hormone can weaken or kill the sperm that get into the uterus. It can stay in place for 3-5 years, depending on which type of IUD is used. PERMANENT METHODS OF CONTRACEPTION  Female tubal ligation. This is when the woman's fallopian tubes are surgically sealed, tied, or blocked to prevent the egg from traveling to the uterus.  Hysteroscopic sterilization. This involves placing a small coil or insert into each fallopian tube. Your doctor uses a technique called hysteroscopy to do the procedure. The device causes scar tissue to form. This results in permanent blockage of the fallopian tubes, so the sperm cannot fertilize the egg. It takes about 3 months after the procedure for the tubes to become blocked. You must use another form of birth control for these 3 months.  Female sterilization. This is when the female has the tubes that carry sperm tied off (vasectomy).This blocks sperm from entering the vagina during sexual intercourse. After the procedure, the man can still ejaculate fluid (semen). NATURAL PLANNING METHODS  Natural family planning. This is not having sexual intercourse or using a barrier method (condom, diaphragm, cervical cap) on days the woman could become pregnant.  Calendar method. This is keeping track of the length of each menstrual cycle and identifying when you are fertile.  Ovulation method. This is avoiding sexual intercourse during ovulation.  Symptothermal  method. This is avoiding sexual intercourse during ovulation, using a thermometer and ovulation symptoms.  Post-ovulation method. This is timing sexual intercourse after you have ovulated. Regardless of which type or method of contraception you choose, it is important that you use condoms to protect against the transmission of sexually transmitted infections (STIs). Talk with your health care provider about which form of contraception is most appropriate for you.   This information is not intended to replace advice given to you by your health care provider. Make sure you discuss any questions you have with your health care provider.   Document Released: 11/23/2005 Document Revised: 11/28/2013 Document Reviewed: 05/18/2013 Elsevier Interactive Patient Education 2016 [employer]. Postpartum Depression and Baby Blues The postpartum period begins right after the birth of a baby. During this time, there is often a great amount of joy and excitement. It is also a time of many changes in the life of the parents. Regardless of how many times a mother gives birth, each child brings new challenges and dynamics to the family. It is not unusual to have feelings of excitement along with confusing shifts in moods, emotions, and thoughts. All mothers are at risk of developing postpartum depression or the "baby blues." These mood changes can occur right after giving birth, or they may occur many months after giving birth. The baby blues or postpartum  depression can be mild or severe. Additionally, postpartum depression can go away rather quickly, or it can be a long-term condition.  CAUSES Raised hormone levels and the rapid drop in those levels are thought to be a main cause of postpartum depression and the baby blues. A number of hormones change during and after pregnancy. Estrogen and progesterone usually decrease right after the delivery of your baby. The levels of thyroid hormone and various cortisol steroids  also rapidly drop. Other factors that play a role in these mood changes include major life events and genetics.  RISK FACTORS If you have any of the following risks for the baby blues or postpartum depression, know what symptoms to watch out for during the postpartum period. Risk factors that may increase the likelihood of getting the baby blues or postpartum depression include:  Having a personal or family history of depression.   Having depression while being pregnant.   Having premenstrual mood issues or mood issues related to oral contraceptives.  Having a lot of life stress.   Having marital conflict.   Lacking a social support network.   Having a baby with special needs.   Having health problems, such as diabetes.  SIGNS AND SYMPTOMS Symptoms of baby blues include:  Brief changes in mood, such as going from extreme happiness to sadness.  Decreased concentration.   Difficulty sleeping.   Crying spells, tearfulness.   Irritability.   Anxiety.  Symptoms of postpartum depression typically begin within the first month after giving birth. These symptoms include:  Difficulty sleeping or excessive sleepiness.   Marked weight loss.   Agitation.   Feelings of worthlessness.   Lack of interest in activity or food.  Postpartum psychosis is a very serious condition and can be dangerous. Fortunately, it is rare. Displaying any of the following symptoms is cause for immediate medical attention. Symptoms of postpartum psychosis include:   Hallucinations and delusions.   Bizarre or disorganized behavior.   Confusion or disorientation.  DIAGNOSIS  A diagnosis is made by an evaluation of your symptoms. There are no medical or lab tests that lead to a diagnosis, but there are various questionnaires that a health care provider may use to identify those with the baby blues, postpartum depression, or psychosis. Often, a screening tool called the [place]  Postnatal Depression Scale is used to diagnose depression in the postpartum period.  TREATMENT The baby blues usually goes away on its own in 1-2 weeks. Social support is often all that is needed. You will be encouraged to get adequate sleep and rest. Occasionally, you may be given medicines to help you sleep.  Postpartum depression requires treatment because it can last several months or longer if it is not treated. Treatment may include individual or group therapy, medicine, or both to address any social, physiological, and psychological factors that may play a role in the depression. Regular exercise, a healthy diet, rest, and social support may also be strongly recommended.  Postpartum psychosis is more serious and needs treatment right away. Hospitalization is often needed. HOME CARE INSTRUCTIONS  Get as much rest as you can. Nap when the baby sleeps.   Exercise regularly. Some women find yoga and walking to be beneficial.   Eat a balanced and nourishing diet.   Do little things that you enjoy. Have a cup of tea, take a bubble bath, read your favorite magazine, or listen to your favorite music.  Avoid alcohol.   Ask for help with household chores, cooking, grocery  shopping, or running errands as needed. Do not try to do everything.   Talk to people close to you about how you are feeling. Get support from your partner, family members, friends, or other new moms.  Try to stay positive in how you think. Think about the things you are grateful for.   Do not spend a lot of time alone.   Only take over-the-counter or prescription medicine as directed by your health care provider.  Keep all your postpartum appointments.   Let your health care provider know if you have any concerns.  SEEK MEDICAL CARE IF: You are having a reaction to or problems with your medicine. SEEK IMMEDIATE MEDICAL CARE IF:  You have suicidal feelings.   You think you may harm the baby or someone  else. MAKE SURE YOU:  Understand these instructions.  Will watch your condition.  Will get help right away if you are not doing well or get worse.   This information is not intended to replace advice given to you by your health care provider. Make sure you discuss any questions you have with your health care provider.   Document Released: 08/27/2004 Document Revised: 11/28/2013 Document Reviewed: 09/04/2013 Elsevier Interactive Patient Education [employer]2016 Elsevier Inc.

## 2016-04-03 NOTE — Lactation Note (Signed)
This note was copied from a baby's chart. Lactation Consultation Note  Baby has not had a stool in the past 16 hours.  Attempted to assist mom with BF but baby did not latch deeply and quickly fell asleep at the breast.  Initiated a NS to see if increased depth would help baby to suckle better but she did not.  Baby does not open wide but does extend her tongue well and also is able to cup a gloved finger and pull it deeply into her mouth.  Asked mom to call for lactation assistance at the next feeding to verify milk transfer prior to DC. MD and RN are aware of plan.  Patient Name: Amy Kevin FentonSierra Bunton Fitzgerald'UToday's Date: 04/03/2016 Reason for consult: Follow-up assessment   Maternal Data Has patient been taught Hand Expression?: Yes  Feeding Feeding Type: Breast Fed Length of feed: 5 min (few sucks)  LATCH Score/Interventions Latch: Repeated attempts needed to sustain latch, nipple held in mouth throughout feeding, stimulation needed to elicit sucking reflex.  Audible Swallowing: A few with stimulation  Type of Nipple: Everted at rest and after stimulation  Comfort (Breast/Nipple): Soft / non-tender  Problem noted: Mild/Moderate discomfort  Hold (Positioning): Assistance needed to correctly position infant at breast and maintain latch.  LATCH Score: 7  Lactation Tools Discussed/Used Tools: Nipple [name]   Consult Status Consult Status: Follow-up Date: 04/03/16    Amy DryerJoseph, Amy Fitzgerald 04/03/2016, 11:37 AM

## 2016-04-03 NOTE — Discharge Summary (Signed)
OB Discharge Summary     Patient Name: Amy Fitzgerald DOB: Jul 30, 1991 MRN: [id number]  Date of admission: 03/31/2016 Delivering MD: Sherre Scarlet   Date of discharge: 04/03/2016  Admitting diagnosis: 39w ctx 5 min apart, pain Intrauterine pregnancy: [redacted]w[redacted]d     Secondary diagnosis:  Principal Problem:   Vaginal delivery Active Problems:   Severe preeclampsia   Positive GBS test   Allergy to pollen   Second-degree perineal laceration, with delivery  Additional problems: None     Discharge diagnosis: Term Pregnancy Delivered, Preeclampsia (severe) and Repair of 2nd Degree Laceration                                                                                                Post partum procedures:MgSO4 Infusion  Augmentation: AROM and Pitocin  Complications: None  Hospital course:  Induction of Labor With Vaginal Delivery   25 y.o. yo G1P1001 at [redacted]w[redacted]d was admitted to the hospital 03/31/2016 for induction of labor.  Indication for induction: Gestational hypertension and Preeclampsia.  Patient had an uncomplicated labor course as follows: Membrane Rupture Time/Date: 2:17 AM ,04/01/2016   Intrapartum Procedures: Episiotomy: None [1]                                         Lacerations:  2nd degree [3];Periurethral [8]  Patient had delivery of a Viable infant.  Information for the patient's newborn:  Amy, Fitzgerald [[id number]]      04/01/2016  Details of delivery can be found in separate delivery note.  Patient had a routine postpartum course. Patient is discharged home 04/03/2016.   Physical exam  Filed Vitals:   04/02/16 1615 04/02/16 2015 04/03/16 0027 04/03/16 0430  BP: 141/80 136/85 133/87 130/87  Pulse: 85 77 81 66  Temp: 99 F (37.2 C) 98.8 F (37.1 C) 97.7 F (36.5 C) 97.8 F (36.6 C)  TempSrc: Oral Oral Oral Oral  Resp: Height:      Weight:      SpO2: 100% 100%     General: alert, cooperative and no distress Lochia: appropriate Uterine  Fundus: firm at U/-1 Incision: Healing well with no significant drainage DVT Evaluation: No evidence of DVT seen on physical exam. Labs: Lab Results  Component Value Date   WBC 16.3* 04/02/2016   HGB 11.4* 04/02/2016   HCT 34.7* 04/02/2016   MCV 83.0 04/02/2016   PLT 234 04/02/2016   CMP Latest Ref Rng 04/02/2016  Glucose 65 - 99 mg/dL 98  BUN 6 - 20 mg/dL <7(W)  Creatinine 2.95 - 1.00 mg/dL 6.21  Sodium 308 - 657 mmol/L 135  Potassium 3.5 - 5.1 mmol/L 4.1  Chloride 101 - 111 mmol/L 108  CO2 22 - 32 mmol/L 22  Calcium 8.9 - 10.3 mg/dL 8.3(L)  Total Protein 6.5 - 8.1 g/dL 6.0(L)  Total Bilirubin 0.3 - 1.2 mg/dL 0.4  Alkaline Phos 38 - 126 U/L 69  AST 15 - 41 U/L 28  ALT 14 -  54 U/L 11(L)    Discharge instruction: per After Visit Summary and "Baby and Me Booklet". Pain Management, Peri-Care, Breastfeeding, Who and When to call for postpartum complications. Information Sheet(s) given PPD & BB, Contraception Choices   After visit meds:    Medication List    STOP taking these medications        promethazine 25 MG tablet  Commonly known as:  PHENERGAN      TAKE these medications        ibuprofen 600 MG tablet  Commonly known as:  ADVIL,MOTRIN  Take 1 tablet (600 mg total) by mouth every 6 (six) hours.     prenatal multivitamin Tabs tablet  Take 1 tablet by mouth daily at 12 noon.        Diet: routine diet  Activity: Advance as tolerated. Pelvic rest for 6 weeks. [facility] Nurse on 5/5 for BP check.   Outpatient follow up:6 weeks Follow up Appt:No future appointments. Follow up Visit:No Follow-up on file.  Postpartum contraception: Undecided  Newborn Data: Live born female  Birth Weight: 7 lb 15.9 oz (3626 g) APGAR: 8, 9  Baby Feeding: Breast Disposition:home with mother   04/03/2016 Cherre RobinsJessica L Atalaya Zappia, CNM

## 2016-04-09 ENCOUNTER — Encounter (HOSPITAL_COMMUNITY): Payer: Self-pay | Admitting: *Deleted

## 2016-04-09 ENCOUNTER — Inpatient Hospital Stay (HOSPITAL_COMMUNITY)
Admission: AD | Admit: 2016-04-09 | Discharge: 2016-04-09 | Disposition: A | Discharge status: Home / Self Care | Payer: BC Managed Care – PPO | Source: Ambulatory Visit | Attending: Obstetrics and Gynecology | Admitting: Obstetrics and Gynecology

## 2016-04-09 DIAGNOSIS — E860 Dehydration: Secondary | ICD-10-CM | POA: Diagnosis not present

## 2016-04-09 DIAGNOSIS — O165 Unspecified maternal hypertension, complicating the puerperium: Secondary | ICD-10-CM | POA: Insufficient documentation

## 2016-04-09 DIAGNOSIS — O1495 Unspecified pre-eclampsia, complicating the puerperium: Secondary | ICD-10-CM

## 2016-04-09 LAB — COMPREHENSIVE METABOLIC PANEL
ALT: 16 U/L (ref 14–54)
AST: 21 U/L (ref 15–41)
Albumin: 3.1 g/dL — ABNORMAL LOW (ref 3.5–5.0)
Alkaline Phosphatase: 61 U/L (ref 38–126)
Anion gap: 10 (ref 5–15)
BILIRUBIN TOTAL: 0.6 mg/dL (ref 0.3–1.2)
BUN: 7 mg/dL (ref 6–20)
CO2: 22 mmol/L (ref 22–32)
CREATININE: 0.79 mg/dL (ref 0.44–1.00)
Calcium: 8.8 mg/dL — ABNORMAL LOW (ref 8.9–10.3)
Chloride: 106 mmol/L (ref 101–111)
Glucose, Bld: 73 mg/dL (ref 65–99)
POTASSIUM: 3.4 mmol/L — AB (ref 3.5–5.1)
Sodium: 138 mmol/L (ref 135–145)
TOTAL PROTEIN: 7.3 g/dL (ref 6.5–8.1)

## 2016-04-09 LAB — URINALYSIS, ROUTINE W REFLEX MICROSCOPIC
GLUCOSE, UA: NEGATIVE mg/dL
Ketones, ur: 15 mg/dL — AB
Nitrite: NEGATIVE
PH: 6 (ref 5.0–8.0)
Protein, ur: >300 mg/dL — AB
SPECIFIC GRAVITY, URINE: 1.015 (ref 1.005–1.030)

## 2016-04-09 LAB — CBC
HEMATOCRIT: 36.1 % (ref 36.0–46.0)
Hemoglobin: 11.7 g/dL — ABNORMAL LOW (ref 12.0–15.0)
MCH: 27.3 pg (ref 26.0–34.0)
MCHC: 32.4 g/dL (ref 30.0–36.0)
MCV: 84.3 fL (ref 78.0–100.0)
Platelets: 271 10*3/uL (ref 150–400)
RBC: 4.28 MIL/uL (ref 3.87–5.11)
RDW: 14.1 % (ref 11.5–15.5)
WBC: 6.7 10*3/uL (ref 4.0–10.5)

## 2016-04-09 LAB — URINE MICROSCOPIC-ADD ON

## 2016-04-09 LAB — PROTEIN / CREATININE RATIO, URINE
Creatinine, Urine: 277 mg/dL
Protein Creatinine Ratio: 0.55 mg/mg{Cre} — ABNORMAL HIGH (ref 0.00–0.15)
TOTAL PROTEIN, URINE: 152 mg/dL

## 2016-04-09 LAB — LACTATE DEHYDROGENASE: LDH: 224 U/L — AB (ref 98–192)

## 2016-04-09 LAB — URIC ACID: URIC ACID, SERUM: 5.7 mg/dL (ref 2.3–6.6)

## 2016-04-09 MED ORDER — NIFEDIPINE ER 30 MG PO TB24: 30.0000 mg | ORAL_TABLET | Freq: Every day | ORAL | Status: DC

## 2016-04-09 MED ORDER — NIFEDIPINE ER OSMOTIC RELEASE 30 MG PO TB24
30.0000 mg | ORAL_TABLET | Freq: Every day | ORAL | Status: DC
  Administered 2016-04-09: 30 mg via ORAL
  Filled 2016-04-09: qty 1

## 2016-04-09 MED ORDER — NIFEDIPINE 10 MG PO CAPS
20.0000 mg | ORAL_CAPSULE | Freq: Once | ORAL | Status: AC
  Administered 2016-04-09: 20 mg via ORAL
  Filled 2016-04-09: qty 2

## 2016-04-09 NOTE — MAU Note (Signed)
Pt post partum 04/01/16. Had preeclampsia on Magnesium after delivery. Nurse came out to the house and was unable to get an accurate b/p told her to come to MAU to get an accurate reading.

## 2016-04-09 NOTE — MAU Provider Note (Signed)
Amy Fitzgerald is a 25yo, G1P1,  Eight days postpartum, S/p Spontaneous vaginal delivery on 04/01/16 and pre-eclampsia (severe) and repair of second degree laceration. Pt is actively moving from [place] three hours away today.  Prior to her departure, the [facility] RN  Visited and provided a blood pressure check. Pt's blood pressure was noted to be in the 140-150's/80-100's.  As a result, pt was sent to the MAU for follow up evaluation before the PT leaves town this evening.    Denies HA, blurred vision, or epigastric pain.   History     Patient Active Problem List   Diagnosis Date Noted  . Vaginal delivery 04/01/2016  . Second-degree perineal laceration, with delivery 04/01/2016  . Severe preeclampsia 03/31/2016  . Positive GBS test 03/31/2016  . 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 Morbid obesity (HCC) 01/23/2016    Chief Complaint  Patient presents with  . Hypertension   HPI  OB History    Gravida Para Term Preterm AB TAB SAB Ectopic Multiple Living   0 1      Past Medical History  Diagnosis Date  . Hypertension   . Pregnancy induced hypertension     Past Surgical History  Procedure Laterality Date  . No past surgeries      History reviewed. No pertinent family history.  Social History  Substance Use Topics  . Smoking status: Never Smoker   . Smokeless tobacco: None  . Alcohol Use: No    Allergies: No Known Allergies  Prescriptions prior to admission  Medication Sig Dispense Refill Last Dose  . ferrous sulfate 325 (65 FE) MG tablet Take 325 mg by mouth daily with breakfast.   04/08/2016 at Unknown time  . ibuprofen (ADVIL,MOTRIN) 600 MG tablet Take 1 tablet (600 mg total) by mouth every 6 (six) hours. 30 tablet 0 04/09/2016 at Unknown time  . Prenatal Vit-Fe Fumarate-FA (PRENATAL MULTIVITAMIN) TABS tablet Take 1 tablet by mouth daily at 12 noon.   Past Month at Unknown time    ROS Physical Exam   Blood pressure 129/86, pulse 72, temperature 98.6 F (37  C), temperature source Oral, resp. rate 18, height  (1.626 m), weight 115.713 kg (255 lb 1.6 oz), currently breastfeeding.  Filed Vitals:   04/09/16 1653 04/09/16 1700 04/09/16 1724 04/09/16 1731  BP: 128/92 129/91 129/86 138/83  Pulse: 82 73 72 69  Temp:      TempSrc:      Resp: 18     Height:      Weight:       Results for orders placed or performed during the hospital encounter of 04/09/16 (from the past 24 hour(s))  Urinalysis, Routine w reflex microscopic (not at [facility])     Status: Abnormal   Collection Time: 04/09/16  4:40 PM  Result Value Ref Range   Color, Urine AMBER (A) YELLOW   APPearance CLEAR CLEAR   Specific Gravity, Urine 1.015 1.005 - 1.030   pH 6.0 5.0 - 8.0   Glucose, UA NEGATIVE NEGATIVE mg/dL   Hgb urine dipstick LARGE (A) NEGATIVE   Bilirubin Urine SMALL (A) NEGATIVE   Ketones, ur 15 (A) NEGATIVE mg/dL   Protein, ur >161 (A) NEGATIVE mg/dL   Nitrite NEGATIVE NEGATIVE   Leukocytes, UA MODERATE (A) NEGATIVE  Urine microscopic-add on     Status: Abnormal   Collection Time: 04/09/16  4:40 PM  Result Value Ref Range   Squamous Epithelial / LPF  0-5 (A) NONE SEEN   WBC, UA TOO NUMEROUS TO COUNT 0 - 5 WBC/hpf   RBC / HPF 6-30 0 - 5 RBC/hpf   Bacteria, UA MANY (A) NONE SEEN  CBC     Status: Abnormal   Collection Time: 04/09/16  5:20 PM  Result Value Ref Range   WBC 6.7 4.0 - 10.5 K/uL   RBC 4.28 3.87 - 5.11 MIL/uL   Hemoglobin 11.7 (L) 12.0 - 15.0 g/dL   HCT 16.136.1 09.636.0 - 04.546.0 %   MCV 84.3 78.0 - 100.0 fL   MCH 27.3 26.0 - 34.0 pg   MCHC 32.4 30.0 - 36.0 g/dL   RDW 40.914.1 81.111.5 - 91.415.5 %   Platelets 271 150 - 400 K/uL  Comprehensive metabolic panel     Status: Abnormal   Collection Time: 04/09/16  5:20 PM  Result Value Ref Range   Sodium 138 135 - 145 mmol/L   Potassium 3.4 (L) 3.5 - 5.1 mmol/L   Chloride 106 101 - 111 mmol/L   CO2 22 22 - 32 mmol/L   Glucose, Bld 73 65 - 99 mg/dL   BUN 7 6 - 20 mg/dL   Creatinine, Ser 7.820.79 0.44 - 1.00 mg/dL    Calcium 8.8 (L) 8.9 - 10.3 mg/dL   Total Protein 7.3 6.5 - 8.1 g/dL   Albumin 3.1 (L) 3.5 - 5.0 g/dL   AST 21 15 - 41 U/L   ALT 16 14 - 54 U/L   Alkaline Phosphatase 61 38 - 126 U/L   Total Bilirubin 0.6 0.3 - 1.2 mg/dL   GFR calc non Af Amer >60 >60 mL/min   GFR calc Af Amer >60 >60 mL/min   Anion gap 10 5 - 15  Lactate dehydrogenase     Status: Abnormal   Collection Time: 04/09/16  5:20 PM  Result Value Ref Range   LDH 224 (H) 98 - 192 U/L  Uric acid     Status: None   Collection Time: 04/09/16  5:20 PM  Result Value Ref Range   Uric Acid, Serum 5.7 2.3 - 6.6 mg/dL    Physical Exam  Constitutional: She is oriented to person, place, and time. She appears well-developed.  HENT:  Head: Normocephalic.  Eyes: Pupils are equal, round, and reactive to light.  Neck: Normal range of motion.  Cardiovascular: Normal rate and regular rhythm.   Respiratory: Effort normal and breath sounds normal.  GI: Soft.  Musculoskeletal: Normal range of motion.  Neurological: She is alert and oriented to person, place, and time. She has normal reflexes.  Skin: Skin is warm and dry.  Psychiatric: She has a normal mood and affect. Her behavior is normal.    ED Course  Assessment: S/p SVD on 04/01/16  Hx Preeclampsia, Severe S/p  MGSO4 postpartum Elevated BP Dehydration, +15 Ketones  Interrum Plan  (1700- : Consult with Dr. Su Hiltoberts Serial BP  PIH abnornal, PCR .55 Procardia 30 mg XL, now Procardia 20mg  immediate release, now Increase fluid intake Establish care as soon as possible with local obstetrics and Gynecologist Follow up for Blood pressure check in 1-2 days with new provider Report to oncoming to provider,  K. Mayford KnifeWilliams, CNM   Alphonzo Severanceachel Dabney Schanz CNM, MSN 04/09/2016 5:28 PM

## 2016-04-09 NOTE — MAU Note (Signed)
Spoke with Sue LushAndrea in [facility] who questioned procardia doses.  Gave pharm Amy Fitzgerald, CNM direct telephone number to speak about order directly.  Sue LushAndrea updated RN to administer both procardia meds at this time due to Dr. Les Pouobert's orders [facility].

## 2016-04-09 NOTE — Discharge Instructions (Signed)
Postpartum Hypertension  Postpartum hypertension is high blood pressure after pregnancy that remains higher than normal for more than two days after delivery. You may not realize that you have postpartum hypertension if your blood pressure is not being checked regularly. In some cases, postpartum hypertension will go away on its own, usually within a week of delivery. However, for some women, medical treatment is required to prevent serious complications, such as seizures or stroke.  The following things can affect your blood pressure:  · The type of delivery you had.  · Having received IV fluids or other medicines during or after delivery.  CAUSES   Postpartum hypertension may be caused by any of the following or by a combination of any of the following:  · Hypertension that existed before pregnancy (chronic hypertension).  · Gestational hypertension.  · Preeclampsia or eclampsia.  · Receiving a lot of fluid through an IV during or after delivery.  · Medicines.  · HELLP syndrome.  · Hyperthyroidism.  · Stroke.  · Other rare neurological or blood disorders.  In some cases, the cause may not be known.  RISK FACTORS  Postpartum hypertension can be related to one or more risk factors, such as:  · Chronic hypertension. In some cases, this may not have been diagnosed before pregnancy.  · Obesity.  · Type 2 diabetes.  · Kidney disease.  · Family history of preeclampsia.  · Other medical conditions that cause hormonal imbalances.  SIGNS AND SYMPTOMS  As with all types of hypertension, postpartum hypertension may not have any symptoms. Depending on how high your blood pressure is, you may experience:  · Headaches. These may be mild, moderate, or severe. They may also be steady, constant, or sudden in onset (thunderclap headache).  · Visual changes.  · Dizziness.  · Shortness of breath.  · Swelling of your hands, feet, lower legs, or face. In some cases, you may have swelling in more than one of these locations.  · Heart  palpitations or a racing heartbeat.  · Difficulty breathing while lying down.  · Decreased urination.  Other rare signs and symptoms may include:  · Sweating more than usual. This lasts longer than a few days after delivery.  · Chest pain.  · Sudden dizziness when you get up from sitting or lying down.  · Seizures.  · Nausea or vomiting.  · Abdominal pain.  DIAGNOSIS  The diagnosis of postpartum hypertension is made through a combination of physical examination findings and testing of your blood and urine. You may also have additional tests, such as a CT scan or an MRI, to check for other complications of postpartum hypertension.  TREATMENT  When blood pressure is high enough to require treatment, your options may include:  · Medicines to reduce blood pressure (antihypertensives). Tell your health care provider if you are breastfeeding or if you plan to breastfeed. There are many antihypertensive medicines that are safe to take while breastfeeding.  · Stopping medicines that may be causing hypertension.  · Treating medical conditions that are causing hypertension.  · Treating the complications of hypertension, such as seizures, stroke, or kidney problems.  Your health care provider will also continue to monitor your blood pressure closely and repeatedly until it is within a safe range for you.   HOME CARE INSTRUCTIONS  · Take medicines only as directed by your health care provider.  · Get regular exercise after your health care provider tells you that it is safe.  · Follow   your health care provider's recommendations on fluid and salt restrictions.  · Do not use any tobacco products, including cigarettes, chewing tobacco, or electronic cigarettes. If you need help quitting, ask your health care provider.  · Keep all follow-up visits as directed by your health care provider. This is important.  SEEK MEDICAL CARE IF:  · Your symptoms get worse.  · You have new symptoms, such as:    Headache.    Dizziness.    Visual  changes.  SEEK IMMEDIATE MEDICAL CARE IF:  · You develop a severe or sudden headache.  · You have seizures.  · You develop numbness or weakness on one side of your body.  · You have difficulty thinking, speaking, or swallowing.  · You develop severe abdominal pain.  · You develop difficulty breathing, chest pain, a racing heartbeat, or heart palpitations.  These symptoms may represent a serious problem that is an emergency. Do not wait to see if the symptoms will go away. Get medical help right away. Call your local emergency services (911 in the [place] Do not drive yourself to the hospital.     This information is not intended to replace advice given to you by your health care provider. Make sure you discuss any questions you have with your health care provider.     Document Released: 07/27/2014 Document Reviewed: 07/27/2014  Elsevier Interactive Patient Education ©2016 [employer] Inc[employer]

## 2016-04-09 NOTE — Progress Notes (Signed)
S: Denies h/a, visual changes, epigastric pain or difficulty breathing.  O:  Gen: NAD Lungs: CTAB CV: RRR w/o M/R/G Abdomen: soft, NT Ext: 1+ DTRs, no clonus, 1+ edema  Today's Vitals   04/09/16 1949 04/09/16 2001 04/09/16 2019 04/09/16 2109  BP: 140/105 121/89 119/92 106/78  Pulse: 89 83 83   Temp:    98.7 F (37.1 C)  TempSrc:    Oral  Resp:      Height:      Weight:      PainSc:       Procardia 30 mg XL given po at 1923 Procardia 20 mg immediate release given po at 1923  A: 25 yo G1nowP1, s/p SVD on 04/01/16 w/ postpartum preE. BPs improved.  P: Above BPs reviewed w/ Dr. Su Hiltoberts, who gave ok to d/c. Pt and FOB have assured me that she will have follow-up with an MD (set up by FOB's mother) in the next day or so in either [place] or [address] [place]. Strict preE precautions given (severe H/A, visual disturbances, upper quadrant tenderness, CP, SOB, severe N/V), and couple informed f/u would then need to occur sooner -- in the ER. Both verbalized understanding. Also instructed pt to refrain from driving and limit activity to sitting/laying back in chair. Advised frequent stops tonight [place] [facility] [place]  Written script for Procardia 30 mg XL (#100 w/ 3 refills) given. Pt instructed to take every night at 1923. All questions answered to couple's satisfaction.  D/C'd in stable condition.   Sherre ScarletKimberly Ryian Lynde, CNM 04/09/16, 9:36 PM

## 2016-12-07 NOTE — L&D Delivery Note (Signed)
Patient is 26 y.o. G2P1001 4078w0d admitted for IOL for A2GDM. S/p IOL with foley bulb, cytotec, followed by Pitocin. AROM at 1820.  Delivery Note At 9:48 PM a viable female was delivered via Vaginal, Spontaneous (Presentation: LOA).  APGAR: 2, 9; weight 10 lb 2.1 oz (4595 g).   Placenta status: Intact.  Cord: 3V with the following complications: Nuchal x1.  Cord pH: 7.17  Anesthesia: Local Episiotomy: None Lacerations:  2nd degree Suture Repair: 2.0 vicryl Est. Blood Loss (mL):  600  Head delivered LOA. Nuchal cord present x1; easily reduced. Shoulder did not deliver. Shoulder dystocia called. mcRoberts and suprapubic pressure tried without release of shoulder. Next move tried was rotational with wood's screw and grasping posterior shoulder. Shoulders and body then delivered in usual fashion. Infant placed on mother's abdomen for stimulation and cord blood. Weak cry. Cord clamped x 2 and given to awaiting NICU team. Cord blood drawn. Placenta delivered spontaneously with gentle cord traction. Cytotec x600mg  orally given due to uterine bleeding. Fundus firm with massage and Pitocin. Perineum inspected and found to have 2nd degree laceration, which was repaired with good hemostasis achieved.   Mom to postpartum.  Baby to Couplet care / Skin to Skin.   Caryl AdaJazma Raiden Yearwood, DO OB Fellow

## 2017-06-08 ENCOUNTER — Ambulatory Visit (INDEPENDENT_AMBULATORY_CARE_PROVIDER_SITE_OTHER): Payer: Medicaid Other | Admitting: Certified Nurse Midwife

## 2017-06-08 ENCOUNTER — Other Ambulatory Visit (HOSPITAL_COMMUNITY)
Admission: RE | Admit: 2017-06-08 | Discharge: 2017-06-08 | Disposition: A | Discharge status: Home / Self Care | Payer: Medicaid Other | Source: Ambulatory Visit | Attending: Certified Nurse Midwife | Admitting: Certified Nurse Midwife

## 2017-06-08 ENCOUNTER — Encounter: Payer: Self-pay | Admitting: Certified Nurse Midwife

## 2017-06-08 VITALS — BP 92/70 | HR 101 | Wt 262.2 lb

## 2017-06-08 DIAGNOSIS — O99212 Obesity complicating pregnancy, second trimester: Secondary | ICD-10-CM

## 2017-06-08 DIAGNOSIS — O0990 Supervision of high risk pregnancy, unspecified, unspecified trimester: Secondary | ICD-10-CM | POA: Diagnosis not present

## 2017-06-08 DIAGNOSIS — O139 Gestational [pregnancy-induced] hypertension without significant proteinuria, unspecified trimester: Secondary | ICD-10-CM | POA: Insufficient documentation

## 2017-06-08 DIAGNOSIS — I10 Essential (primary) hypertension: Secondary | ICD-10-CM

## 2017-06-08 DIAGNOSIS — O10012 Pre-existing essential hypertension complicating pregnancy, second trimester: Secondary | ICD-10-CM

## 2017-06-08 DIAGNOSIS — O09299 Supervision of pregnancy with other poor reproductive or obstetric history, unspecified trimester: Secondary | ICD-10-CM | POA: Insufficient documentation

## 2017-06-08 DIAGNOSIS — O0992 Supervision of high risk pregnancy, unspecified, second trimester: Secondary | ICD-10-CM

## 2017-06-08 MED ORDER — ASPIRIN 81 MG PO CHEW: 81.0000 mg | CHEWABLE_TABLET | Freq: Every day | ORAL | 12 refills | Status: DC

## 2017-06-08 MED ORDER — PRENATE PIXIE 10-0.6-0.4-200 MG PO CAPS: 1.0000 | ORAL_CAPSULE | Freq: Every day | ORAL | 12 refills | Status: DC

## 2017-06-08 NOTE — Progress Notes (Signed)
Subjective:    Amy Fitzgerald is being seen today for her first obstetrical visit.  This is not a planned pregnancy. She is at [redacted]w[redacted]d gestation. Her obstetrical history is significant for obesity and pre-eclampsia. Relationship with FOB: significant other, not living together. Patient does intend to breast feed. Pregnancy history fully reviewed.  The information documented in the HPI was reviewed and verified.  Menstrual History: OB History    Gravida Para Term Preterm AB Living   2 1 1     1    SAB TAB Ectopic Multiple Live Births         0 1       Patient's last menstrual period was 03/01/2017 (approximate).    Past Medical History:  Diagnosis Date  . Anemia    During pregnancy  . Hypertension   . Pregnancy induced hypertension     Past Surgical History:  Procedure Laterality Date  . NO PAST SURGERIES       (Not in a hospital admission) No Known Allergies  Social History  Substance Use Topics  . Smoking status: Never Smoker  . Smokeless tobacco: Never Used  . Alcohol use No    Family History  Problem Relation Age of Onset  . Arthritis Brother   . Diabetes Maternal Grandmother   . Cancer Maternal Grandmother   . Arthritis Maternal Grandmother   . Cancer Maternal Grandfather      Review of Systems Constitutional: negative for weight loss Gastrointestinal: negative for vomiting Genitourinary:negative for genital lesions and vaginal discharge and dysuria Musculoskeletal:negative for back pain Behavioral/Psych: negative for abusive relationship, depression, illegal drug usage and tobacco use    Objective:    BP 92/70   Pulse (!) 101   Wt 262 lb 3.2 oz (118.9 kg)   LMP 03/01/2017 (Approximate)   BMI 45.01 kg/m  General Appearance:    Alert, cooperative, no distress, appears stated age  Head:    Normocephalic, without obvious abnormality, atraumatic  Eyes:    PERRL, conjunctiva/corneas clear, EOM's intact, fundi    benign, both eyes  Ears:    Normal TM's and  external ear canals, both ears  Nose:   Nares normal, septum midline, mucosa normal, no drainage    or sinus tenderness  Throat:   Lips, mucosa, and tongue normal; teeth and gums normal  Neck:   Supple, symmetrical, trachea midline, no adenopathy;    thyroid:  no enlargement/tenderness/nodules; no carotid   bruit or JVD  Back:     Symmetric, no curvature, ROM normal, no CVA tenderness  Lungs:     Clear to auscultation bilaterally, respirations unlabored  Chest Wall:    No tenderness or deformity   Heart:    Regular rate and rhythm, S1 and S2 normal, no murmur, rub   or gallop  Breast Exam:    No tenderness, masses, or nipple abnormality  Abdomen:     Soft, non-tender, bowel sounds active all four quadrants,    no masses, no organomegaly  Genitalia:    Normal female without lesion, discharge or tenderness  Extremities:   Extremities normal, atraumatic, no cyanosis or edema  Pulses:   2+ and symmetric all extremities  Skin:   Skin color, texture, turgor normal, no rashes or lesions  Lymph nodes:   Cervical, supraclavicular, and axillary nodes normal  Neurologic:   CNII-XII intact, normal strength, sensation and reflexes    throughout         Cervix:   Long, thick, closed and posterior.  FH:    FHR: 156 by doppler    Lab Review Urine pregnancy test Labs reviewed no Radiologic studies reviewed no  Assessment & Plan    Pregnancy at 6916w1d weeks    1. Supervision of high risk pregnancy, antepartum      - Cytology - PAP - Cervicovaginal ancillary only - HIV antibody - Hemoglobinopathy evaluation - Varicella zoster antibody, IgG - VITAMIN D 25 Hydroxy (Vit-D Deficiency, Fractures) - Culture, OB Urine - Prenatal Profile I - US MFM OB DETAIL +14 WK; Future - Prenat-FeAsp-Meth-FA-DHA w/o A (PRENATE PIXIE) 10-0.6-0.4-200 MG CAPS; Take 1 tablet by mouth daily.  Dispense: 30 capsule; Refill: 12  2. Hypertension, unspecified type    - aspirin 81 MG chewable tablet; Chew 1 tablet (81 mg  total) by mouth daily.  Dispense: 30 tablet; Refill: 12  3. Morbid obesity (HCC)         Prenatal vitamins.  Counseling provided regarding continued use of seat belts, cessation of alcohol consumption, smoking or use of illicit drugs; infection precautions i.e., influenza/TDAP immunizations, toxoplasmosis,CMV, parvovirus, listeria and varicella; workplace safety, exercise during pregnancy; routine dental care, safe medications, sexual activity, hot tubs, saunas, pools, travel, caffeine use, fish and methlymercury, potential toxins, hair treatments, varicose veins Weight gain recommendations per IOM guidelines reviewed: underweight/BMI< 18.5--> gain 28 - 40 lbs; normal weight/BMI 18.5 - 24.9--> gain 25 - 35 lbs; overweight/BMI 25 - 29.9--> gain 15 - 25 lbs; obese/BMI >30->gain  11 - 20 lbs Problem list reviewed and updated. FIRST/CF mutation testing/NIPT/QUAD SCREEN/fragile X/Ashkenazi Jewish population testing/Spinal muscular atrophy discussed: requested. Role of ultrasound in pregnancy discussed; fetal survey: ordered. Amniocentesis discussed: not indicated.  Meds ordered this encounter  Medications  . aspirin 81 MG chewable tablet    Sig: Chew 1 tablet (81 mg total) by mouth daily.    Dispense:  30 tablet    Refill:  12  . Prenat-FeAsp-Meth-FA-DHA w/o A (PRENATE PIXIE) 10-0.6-0.4-200 MG CAPS    Sig: Take 1 tablet by mouth daily.    Dispense:  30 capsule    Refill:  12    Please process coupon: Rx BIN: [id number], RxPCN: [id number], RxGRP: [id number]: [id number], Rx: [id number]: [id number]  SUF: 01   Orders Placed This Encounter  Procedures  . Culture, OB Urine  . US MFM OB DETAIL +14 WK    Standing Status:   Future    Standing Expiration Date:   08/09/2018    Order Specific Question:   Reason for Exam (SYMPTOM  OR DIAGNOSIS REQUIRED)    Answer:   fetal anatomy scan, late to care, hx of HTN and morbid obesity    Order Specific Question:   Preferred imaging location?    Answer:   [facility]-Ultrasound  . HIV antibody   . Hemoglobinopathy evaluation  . Varicella zoster antibody, IgG  . VITAMIN D 25 Hydroxy (Vit-D Deficiency, Fractures)  . Prenatal Profile I  . Hemoglobin A1c    Follow up in 4 weeks. 50% of 30 min visit spent on counseling and coordination of care.

## 2017-06-08 NOTE — Progress Notes (Signed)
Patient reports she is doing well 

## 2017-06-10 LAB — CERVICOVAGINAL ANCILLARY ONLY
BACTERIAL VAGINITIS: POSITIVE — AB
CANDIDA VAGINITIS: NEGATIVE
Chlamydia: NEGATIVE
Neisseria Gonorrhea: NEGATIVE
TRICH (WINDOWPATH): NEGATIVE

## 2017-06-10 LAB — URINE CULTURE, OB REFLEX

## 2017-06-10 LAB — CULTURE, OB URINE

## 2017-06-11 LAB — PRENATAL PROFILE I(LABCORP)
ANTIBODY SCREEN: NEGATIVE
BASOS ABS: 0 10*3/uL (ref 0.0–0.2)
Basos: 0 %
EOS (ABSOLUTE): 0 10*3/uL (ref 0.0–0.4)
Eos: 0 %
HEMOGLOBIN: 11.2 g/dL (ref 11.1–15.9)
Hematocrit: 35 % (ref 34.0–46.6)
Hepatitis B Surface Ag: NEGATIVE
Immature Grans (Abs): 0 10*3/uL (ref 0.0–0.1)
Immature Granulocytes: 0 %
LYMPHS ABS: 3.1 10*3/uL (ref 0.7–3.1)
Lymphs: 30 %
MCH: 27 pg (ref 26.6–33.0)
MCHC: 32 g/dL (ref 31.5–35.7)
MCV: 84 fL (ref 79–97)
Monocytes Absolute: 0.8 10*3/uL (ref 0.1–0.9)
Monocytes: 8 %
NEUTROS ABS: 6.3 10*3/uL (ref 1.4–7.0)
Neutrophils: 62 %
PLATELETS: 291 10*3/uL (ref 150–379)
RBC: 4.15 x10E6/uL (ref 3.77–5.28)
RDW: 14.3 % (ref 12.3–15.4)
RPR Ser Ql: NONREACTIVE
Rh Factor: POSITIVE
Rubella Antibodies, IGG: 1.45 index (ref >0.99)
WBC: 10.2 10*3/uL (ref 3.4–10.8)

## 2017-06-11 LAB — HEMOGLOBINOPATHY EVALUATION
HEMOGLOBIN A2 QUANTITATION: 2.2 % (ref 1.8–3.2)
HEMOGLOBIN F QUANTITATION: 0 % (ref 0.0–2.0)
HGB C: 0 %
HGB S: 0 %
HGB VARIANT: 0 %
Hgb A: 97.8 % (ref 96.4–98.8)

## 2017-06-11 LAB — HEMOGLOBIN A1C
Est. average glucose Bld gHb Est-mCnc: 126 mg/dL
HEMOGLOBIN A1C: 6 % — AB (ref 4.8–5.6)

## 2017-06-11 LAB — CYTOLOGY - PAP: Diagnosis: NEGATIVE

## 2017-06-11 LAB — VARICELLA ZOSTER ANTIBODY, IGG: VARICELLA: 452 {index} (ref >165)

## 2017-06-11 LAB — HIV ANTIBODY (ROUTINE TESTING W REFLEX): HIV Screen 4th Generation wRfx: NONREACTIVE

## 2017-06-11 LAB — VITAMIN D 25 HYDROXY (VIT D DEFICIENCY, FRACTURES): Vit D, 25-Hydroxy: 18.5 ng/mL — ABNORMAL LOW (ref 30.0–100.0)

## 2017-06-14 ENCOUNTER — Other Ambulatory Visit: Payer: Self-pay | Admitting: Certified Nurse Midwife

## 2017-06-14 ENCOUNTER — Telehealth: Payer: Self-pay

## 2017-06-14 DIAGNOSIS — Z3480 Encounter for supervision of other normal pregnancy, unspecified trimester: Secondary | ICD-10-CM | POA: Insufficient documentation

## 2017-06-14 DIAGNOSIS — B9689 Other specified bacterial agents as the cause of diseases classified elsewhere: Secondary | ICD-10-CM

## 2017-06-14 DIAGNOSIS — R7309 Other abnormal glucose: Secondary | ICD-10-CM | POA: Insufficient documentation

## 2017-06-14 DIAGNOSIS — R7989 Other specified abnormal findings of blood chemistry: Secondary | ICD-10-CM

## 2017-06-14 DIAGNOSIS — N760 Acute vaginitis: Principal | ICD-10-CM

## 2017-06-14 MED ORDER — VITAMIN D (ERGOCALCIFEROL) 1.25 MG (50000 UNIT) PO CAPS: 50000.0000 [IU] | ORAL_CAPSULE | ORAL | 2 refills | Status: DC

## 2017-06-14 MED ORDER — METRONIDAZOLE 500 MG PO TABS: 500.0000 mg | ORAL_TABLET | Freq: Two times a day (BID) | ORAL | 0 refills | Status: DC

## 2017-06-14 NOTE — Telephone Encounter (Signed)
Patient notified of results and Rx. Forwarded to [facility] Lee[facility] office

## 2017-06-14 NOTE — Telephone Encounter (Signed)
-----   Message from Roe Coombsachelle A Denney, CNM sent at 06/14/2017 10:45 AM EDT ----- Please let her know that her A1C is elevated and schedule her for an early 2 hour OGTT ASAP.  Marland Kitchen. Please let her know that her vitamin D level is low.  Vitamin D weekly tablet has been sent to her pharmacy for her to take.   Please call patient and notify of +BV results. Metrondiazole 500 mg PO BID x7 days. #14 no refills.   Please also ask her if she will need anything for yeast.   Thank you, Orvilla Cornwallachelle Denney CNM

## 2017-06-17 ENCOUNTER — Other Ambulatory Visit: Payer: Medicaid Other

## 2017-06-23 ENCOUNTER — Other Ambulatory Visit: Payer: Medicaid Other

## 2017-06-24 ENCOUNTER — Other Ambulatory Visit: Payer: Medicaid Other

## 2017-06-25 ENCOUNTER — Other Ambulatory Visit: Payer: Medicaid Other

## 2017-06-25 DIAGNOSIS — Z3480 Encounter for supervision of other normal pregnancy, unspecified trimester: Secondary | ICD-10-CM

## 2017-06-26 LAB — GLUCOSE TOLERANCE, 2 HOURS W/ 1HR
GLUCOSE, 1 HOUR: 152 mg/dL (ref 65–179)
GLUCOSE, 2 HOUR: 124 mg/dL (ref 65–152)
Glucose, Fasting: 117 mg/dL — ABNORMAL HIGH (ref 65–91)

## 2017-06-28 ENCOUNTER — Other Ambulatory Visit: Payer: Self-pay | Admitting: Certified Nurse Midwife

## 2017-06-28 DIAGNOSIS — O24419 Gestational diabetes mellitus in pregnancy, unspecified control: Secondary | ICD-10-CM | POA: Insufficient documentation

## 2017-06-28 DIAGNOSIS — O24410 Gestational diabetes mellitus in pregnancy, diet controlled: Secondary | ICD-10-CM

## 2017-06-29 ENCOUNTER — Telehealth: Payer: Self-pay

## 2017-06-29 DIAGNOSIS — O0990 Supervision of high risk pregnancy, unspecified, unspecified trimester: Secondary | ICD-10-CM

## 2017-06-29 MED ORDER — ACCU-CHEK FASTCLIX LANCET KIT: 1.0000 | PACK | Freq: Four times a day (QID) | 12 refills | Status: DC

## 2017-06-29 MED ORDER — GLUCOSE BLOOD VI STRP: ORAL_STRIP | 12 refills | Status: DC

## 2017-06-29 MED ORDER — ACCU-CHEK GUIDE W/DEVICE KIT
1.0000 | PACK | Freq: Four times a day (QID) | 0 refills | Status: DC
Start: 2017-06-29 — End: 2017-10-21

## 2017-06-29 NOTE — Telephone Encounter (Signed)
Advised of results and glucose supplies sent to pharmacy, pt stated that she already has appt for diabetic teaching.

## 2017-07-06 ENCOUNTER — Ambulatory Visit (INDEPENDENT_AMBULATORY_CARE_PROVIDER_SITE_OTHER): Payer: Medicaid Other | Admitting: Obstetrics and Gynecology

## 2017-07-06 VITALS — BP 112/77 | HR 103 | Wt 260.0 lb

## 2017-07-06 DIAGNOSIS — O09892 Supervision of other high risk pregnancies, second trimester: Secondary | ICD-10-CM

## 2017-07-06 DIAGNOSIS — O24410 Gestational diabetes mellitus in pregnancy, diet controlled: Secondary | ICD-10-CM

## 2017-07-06 DIAGNOSIS — O0990 Supervision of high risk pregnancy, unspecified, unspecified trimester: Secondary | ICD-10-CM

## 2017-07-06 DIAGNOSIS — Z8619 Personal history of other infectious and parasitic diseases: Secondary | ICD-10-CM

## 2017-07-06 DIAGNOSIS — O09899 Supervision of other high risk pregnancies, unspecified trimester: Secondary | ICD-10-CM

## 2017-07-06 NOTE — Patient Instructions (Signed)

## 2017-07-06 NOTE — Progress Notes (Signed)
Subjective:  Amy Fitzgerald is a 26 y.o. G2P1001 at 54w1dbeing seen today for ongoing prenatal care.  She is currently monitored for the following issues for this high-risk pregnancy and [place]bid [place]; Previous pregnancy complicated by pregnancy-induced hypertension, antepartum; Allergy to pollen; Hypertension; Low vitamin D level; GDM (gestational diabetes mellitus), class A1; and History of group B Streptococcus (GBS) infection on her problem list.  Patient reports no complaints.  Contractions: Not present. Vag. Bleeding: None.  Movement: Present. Denies leaking of fluid.   The following portions of the patient's history were reviewed and updated as appropriate: allergies, current medications, past family history, past medical history, past social history, past surgical history and problem list. Problem list updated.  Objective:   Vitals:   07/06/17 1311  BP: 112/77  Pulse: (!) 103  Weight: 260 lb (117.9 kg)    Fetal Status: Fetal Heart Rate (bpm): 150   Movement: Present     General:  Alert, oriented and cooperative. Patient is in no acute distress.  Skin: Skin is warm and dry. No rash noted.   Cardiovascular: Normal heart rate noted  Respiratory: Normal respiratory effort, no problems with respiration noted  Abdomen: Soft, gravid, appropriate for gestational age. Pain/Pressure: Present     Pelvic:  Cervical exam deferred        Extremities: Normal range of motion.  Edema: None  Mental Status: Normal mood and affect. Normal behavior. Normal judgment and thought content.   Urinalysis:      Assessment and Plan:  Pregnancy: G2P1001 at 126w1d1. Supervision of high risk pregnancy, antepartum Stable U/S next week  - AFP TETRA  2. GDM (gestational diabetes mellitus), class A1 Pt has not been checking BS Has appt with Nutritionist tomorrow Pregnancy and DM reviewed with pt Importance of BS control reviewed Pt instructed to bring BS to all OB appts  3. Previous  pregnancy complicated by pregnancy-induced hypertension, antepartum BP stable Continue with BASA - Comp Met (CMET) - TSH - Protein / creatinine ratio, urine  4. History of group B Streptococcus (GBS) infection Tx while in labor  Preterm labor symptoms and general obstetric precautions including but not limited to vaginal bleeding, contractions, leaking of fluid and fetal movement were reviewed in detail with the patient. Please refer to After Visit Summary for other counseling recommendations.  Return in about 4 weeks (around 08/03/2017) for OB visit.   ErChancy MilroyMD

## 2017-07-07 ENCOUNTER — Encounter: Payer: Medicaid Other | Attending: Certified Nurse Midwife | Admitting: Registered"

## 2017-07-07 ENCOUNTER — Encounter: Payer: Self-pay | Admitting: Registered"

## 2017-07-07 ENCOUNTER — Other Ambulatory Visit: Payer: Self-pay | Admitting: Certified Nurse Midwife

## 2017-07-07 DIAGNOSIS — O24410 Gestational diabetes mellitus in pregnancy, diet controlled: Secondary | ICD-10-CM | POA: Insufficient documentation

## 2017-07-07 DIAGNOSIS — Z713 Dietary counseling and surveillance: Secondary | ICD-10-CM | POA: Diagnosis not present

## 2017-07-07 DIAGNOSIS — R7309 Other abnormal glucose: Secondary | ICD-10-CM

## 2017-07-07 LAB — COMPREHENSIVE METABOLIC PANEL
ALBUMIN: 3.5 g/dL (ref 3.5–5.5)
ALT: 6 IU/L (ref 0–32)
AST: 12 IU/L (ref 0–40)
Albumin/Globulin Ratio: 1.1 — ABNORMAL LOW (ref 1.2–2.2)
Alkaline Phosphatase: 61 IU/L (ref 39–117)
BUN / CREAT RATIO: 8 — AB (ref 9–23)
BUN: 5 mg/dL — AB (ref 6–20)
CO2: 16 mmol/L — AB (ref 20–29)
CREATININE: 0.66 mg/dL (ref 0.57–1.00)
Calcium: 8.9 mg/dL (ref 8.7–10.2)
Chloride: 103 mmol/L (ref 96–106)
GFR calc non Af Amer: 124 mL/min/{1.73_m2} (ref >59)
GFR, EST AFRICAN AMERICAN: 143 mL/min/{1.73_m2} (ref >59)
GLUCOSE: 143 mg/dL — AB (ref 65–99)
Globulin, Total: 3.3 g/dL (ref 1.5–4.5)
Potassium: 3.9 mmol/L (ref 3.5–5.2)
Sodium: 138 mmol/L (ref 134–144)
TOTAL PROTEIN: 6.8 g/dL (ref 6.0–8.5)

## 2017-07-07 LAB — TSH: TSH: 1.65 u[IU]/mL (ref 0.450–4.500)

## 2017-07-07 NOTE — Progress Notes (Signed)
Patient was seen on 07/07/17 for Gestational Diabetes self-management class at the [facility]. The following learning objectives were met by the patient during this course:   States the definition of Gestational Diabetes  States why dietary management is important in controlling blood glucose  Describes the effects each nutrient has on blood glucose levels  Demonstrates ability to create a balanced meal plan  Demonstrates carbohydrate counting   States when to check blood glucose levels  Demonstrates proper blood glucose monitoring techniques  States the effect of stress and exercise on blood glucose levels  States the importance of limiting caffeine and abstaining from alcohol and smoking  Blood glucose monitor given: none Lot # n/a Exp: n/a Blood glucose reading: 160  Patient instructed to monitor glucose levels: FBS: 60 - <95 1 hour: <140 2 hour: <120  Patient received handouts:  Nutrition Diabetes and Pregnancy  Carbohydrate Counting List  Patient will be seen for follow-up as needed.

## 2017-07-08 ENCOUNTER — Other Ambulatory Visit: Payer: Medicaid Other

## 2017-07-09 LAB — PROTEIN / CREATININE RATIO, URINE
CREATININE, UR: 70.5 mg/dL
PROTEIN UR: 11.7 mg/dL
PROTEIN/CREAT RATIO: 166 mg/g{creat} (ref 0–200)

## 2017-07-10 LAB — AFP TETRA
DIA MOM VALUE: 0.49
DIA VALUE (EIA): 60.74 pg/mL
DSR (By Age)    1 IN: 1035
DSR (SECOND TRIMESTER) 1 IN: 10000
GESTATIONAL AGE AFP: 18.1 wk
MATERNAL AGE AT EDD: 24.9 a
MSAFP Mom: 0.99
MSAFP: 34.3 ng/mL
MSHCG Mom: 4.4
MSHCG: 84793 m[IU]/mL
OSB RISK: 10000
Test Results:: NEGATIVE
UE3 MOM: 1.01
WEIGHT: 260 [lb_av]
uE3 Value: 1.13 ng/mL

## 2017-07-13 ENCOUNTER — Ambulatory Visit (HOSPITAL_COMMUNITY)
Admission: RE | Admit: 2017-07-13 | Discharge: 2017-07-13 | Disposition: A | Discharge status: Home / Self Care | Payer: Medicaid Other | Source: Ambulatory Visit | Attending: Certified Nurse Midwife | Admitting: Certified Nurse Midwife

## 2017-07-13 ENCOUNTER — Other Ambulatory Visit: Payer: Self-pay | Admitting: Certified Nurse Midwife

## 2017-07-13 DIAGNOSIS — O24410 Gestational diabetes mellitus in pregnancy, diet controlled: Secondary | ICD-10-CM

## 2017-07-13 DIAGNOSIS — Z3A19 19 weeks gestation of pregnancy: Secondary | ICD-10-CM | POA: Diagnosis not present

## 2017-07-13 DIAGNOSIS — Z3689 Encounter for other specified antenatal screening: Secondary | ICD-10-CM | POA: Diagnosis present

## 2017-07-13 DIAGNOSIS — O99212 Obesity complicating pregnancy, second trimester: Secondary | ICD-10-CM

## 2017-07-13 DIAGNOSIS — O0990 Supervision of high risk pregnancy, unspecified, unspecified trimester: Secondary | ICD-10-CM

## 2017-08-03 ENCOUNTER — Ambulatory Visit (INDEPENDENT_AMBULATORY_CARE_PROVIDER_SITE_OTHER): Payer: Medicaid Other | Admitting: Obstetrics and Gynecology

## 2017-08-03 VITALS — BP 126/93 | HR 114 | Wt 265.0 lb

## 2017-08-03 DIAGNOSIS — O09899 Supervision of other high risk pregnancies, unspecified trimester: Secondary | ICD-10-CM

## 2017-08-03 DIAGNOSIS — Z8619 Personal history of other infectious and parasitic diseases: Secondary | ICD-10-CM

## 2017-08-03 DIAGNOSIS — O0990 Supervision of high risk pregnancy, unspecified, unspecified trimester: Secondary | ICD-10-CM

## 2017-08-03 DIAGNOSIS — O24410 Gestational diabetes mellitus in pregnancy, diet controlled: Secondary | ICD-10-CM

## 2017-08-03 MED ORDER — GLYBURIDE 2.5 MG PO TABS: 2.5000 mg | ORAL_TABLET | Freq: Every day | ORAL | 3 refills | Status: DC

## 2017-08-03 NOTE — Progress Notes (Signed)
Subjective:  Amy Fitzgerald is a 26 y.o. G2P1001 at [redacted]w[redacted]d being seen today for ongoing prenatal care.  She is currently monitored for the following issues for this high-risk pregnancy and has Morbid obesity (HCC); Previous pregnancy complicated by pregnancy-induced hypertension, antepartum; Allergy to pollen; Hypertension; Low vitamin D level; GDM (gestational diabetes mellitus), class A1; History of group B Streptococcus (GBS) infection; and Supervision of high risk pregnancy, antepartum on her problem list.  Patient reports no complaints.  Contractions: Not present. Vag. Bleeding: None.  Movement: Present. Denies leaking of fluid.   The following portions of the patient's history were reviewed and updated as appropriate: allergies, current medications, past family history, past medical history, past social history, past surgical history and problem list. Problem list updated.  Objective:   Vitals:   08/03/17 1334  BP: (!) 126/93  Pulse: (!) 114  Weight: 265 lb (120.2 kg)    Fetal Status: Fetal Heart Rate (bpm): 158   Movement: Present     General:  Alert, oriented and cooperative. Patient is in no acute distress.  Skin: Skin is warm and dry. No rash noted.   Cardiovascular: Normal heart rate noted  Respiratory: Normal respiratory effort, no problems with respiration noted  Abdomen: Soft, gravid, appropriate for gestational age. Pain/Pressure: Present     Pelvic:  Cervical exam deferred        Extremities: Normal range of motion.  Edema: Trace  Mental Status: Normal mood and affect. Normal behavior. Normal judgment and thought content.   Urinalysis:      Assessment and Plan:  Pregnancy: G2P1001 at [redacted]w[redacted]d  1. Supervision of high risk pregnancy, antepartum Stable  2. Previous pregnancy complicated by pregnancy-induced hypertension, antepartum BP slightly increased today. First increased BP Continue with BASA RTC in 2 weeks for recheck  3. History of group B Streptococcus (GBS)  infection Tx while in labor  4. GDM (gestational diabetes mellitus), class A1 Fasting BS above 100 Will start Diabeta Importance of glucose control and diet reviewed with pt RTC in 2 weeks to check BS  - glyBURIDE (DIABETA) 2.5 MG tablet; Take 1 tablet (2.5 mg total) by mouth at bedtime.  Dispense: 60 tablet; Refill: 3  Preterm labor symptoms and general obstetric precautions including but not limited to vaginal bleeding, contractions, leaking of fluid and fetal movement were reviewed in detail with the patient. Please refer to After Visit Summary for other counseling recommendations.  Return in about 2 weeks (around 08/17/2017) for OB visit.   Hermina Staggers, MD

## 2017-08-13 ENCOUNTER — Ambulatory Visit (HOSPITAL_COMMUNITY)
Admission: RE | Admit: 2017-08-13 | Discharge: 2017-08-13 | Disposition: A | Discharge status: Home / Self Care | Payer: Medicaid Other | Source: Ambulatory Visit | Attending: Certified Nurse Midwife | Admitting: Certified Nurse Midwife

## 2017-08-13 DIAGNOSIS — O0992 Supervision of high risk pregnancy, unspecified, second trimester: Secondary | ICD-10-CM | POA: Insufficient documentation

## 2017-08-13 DIAGNOSIS — Z3A23 23 weeks gestation of pregnancy: Secondary | ICD-10-CM | POA: Diagnosis not present

## 2017-08-13 DIAGNOSIS — Z362 Encounter for other antenatal screening follow-up: Secondary | ICD-10-CM | POA: Insufficient documentation

## 2017-08-13 DIAGNOSIS — O24410 Gestational diabetes mellitus in pregnancy, diet controlled: Secondary | ICD-10-CM | POA: Insufficient documentation

## 2017-08-13 DIAGNOSIS — O99212 Obesity complicating pregnancy, second trimester: Secondary | ICD-10-CM | POA: Diagnosis not present

## 2017-08-13 DIAGNOSIS — O0990 Supervision of high risk pregnancy, unspecified, unspecified trimester: Secondary | ICD-10-CM

## 2017-08-18 ENCOUNTER — Encounter: Payer: Medicaid Other | Admitting: Obstetrics & Gynecology

## 2017-08-21 ENCOUNTER — Other Ambulatory Visit: Payer: Self-pay | Admitting: Certified Nurse Midwife

## 2017-08-21 DIAGNOSIS — O0990 Supervision of high risk pregnancy, unspecified, unspecified trimester: Secondary | ICD-10-CM

## 2017-08-26 ENCOUNTER — Ambulatory Visit (INDEPENDENT_AMBULATORY_CARE_PROVIDER_SITE_OTHER): Payer: Medicaid Other | Admitting: Family Medicine

## 2017-08-26 VITALS — BP 101/71 | HR 94 | Wt 265.0 lb

## 2017-08-26 DIAGNOSIS — O0990 Supervision of high risk pregnancy, unspecified, unspecified trimester: Secondary | ICD-10-CM

## 2017-08-26 DIAGNOSIS — O99212 Obesity complicating pregnancy, second trimester: Secondary | ICD-10-CM

## 2017-08-26 DIAGNOSIS — I10 Essential (primary) hypertension: Secondary | ICD-10-CM

## 2017-08-26 DIAGNOSIS — O24419 Gestational diabetes mellitus in pregnancy, unspecified control: Secondary | ICD-10-CM

## 2017-08-26 DIAGNOSIS — R7989 Other specified abnormal findings of blood chemistry: Secondary | ICD-10-CM

## 2017-08-26 DIAGNOSIS — O0992 Supervision of high risk pregnancy, unspecified, second trimester: Secondary | ICD-10-CM

## 2017-08-26 MED ORDER — VITAMIN D (ERGOCALCIFEROL) 1.25 MG (50000 UNIT) PO CAPS
50000.0000 [IU] | ORAL_CAPSULE | ORAL | 2 refills | Status: DC
Start: 2017-08-26 — End: 2017-12-29

## 2017-08-26 NOTE — Progress Notes (Signed)
Subjective:  Amy Fitzgerald is a 26 y.o. G2P1001 at [redacted]w[redacted]d being seen today for ongoing prenatal care.  She is currently monitored for the following issues for this high-risk pregnancy and has Morbid obesity (HCC); Previous pregnancy complicated by pregnancy-induced hypertension, antepartum; Allergy to pollen; Hypertension; Low vitamin D level; GDM (gestational diabetes mellitus), class A1; History of group B Streptococcus (GBS) infection; and Supervision of high risk pregnancy, antepartum on her problem list.  GDM: Patient taking Glyburide 2.5mg  at bedtime.  Reports no hypoglycemic episodes.  Tolerating medication well Fasting: 90-110 2hr PP: 80-150s, has many in the elevated range.  Patient reports no complaints.  Contractions: Not present. Vag. Bleeding: None.  Movement: Present. Denies leaking of fluid.   The following portions of the patient's history were reviewed and updated as appropriate: allergies, current medications, past family history, past medical history, past social history, past surgical history and problem list. Problem list updated.  Objective:   Vitals:   08/26/17 0917  BP: 101/71  Pulse: 94  Weight: 265 lb (120.2 kg)    Fetal Status: Fetal Heart Rate (bpm): 150   Movement: Present     General:  Alert, oriented and cooperative. Patient is in no acute distress.  Skin: Skin is warm and dry. No rash noted.   Cardiovascular: Normal heart rate noted  Respiratory: Normal respiratory effort, no problems with respiration noted  Abdomen: Soft, gravid, appropriate for gestational age. Pain/Pressure: Present     Pelvic: Vag. Bleeding: None     Cervical exam deferred        Extremities: Normal range of motion.  Edema: Trace  Mental Status: Normal mood and affect. Normal behavior. Normal judgment and thought content.   Urinalysis:      Assessment and Plan:  Pregnancy: G2P1001 at [redacted]w[redacted]d  1. Supervision of high risk pregnancy, antepartum Fht normal  2. Low vitamin D level -  Vitamin D, Ergocalciferol, (DRISDOL) 50000 units CAPS capsule; Take 1 capsule (50,000 Units total) by mouth every 7 (seven) days.  Dispense: 30 capsule; Refill: 2  3. GDM (gestational diabetes mellitus), class A2/B Increase glyburide to 2.5mg  in AM and  in PM.   4. Hypertension, unspecified type Normal today.  5. Morbid obesity (HCC)   Preterm labor symptoms and general obstetric precautions including but not limited to vaginal bleeding, contractions, leaking of fluid and fetal movement were reviewed in detail with the patient. Please refer to After Visit Summary for other counseling recommendations.  Return in about 2 weeks (around 09/09/2017).   Levie Heritage, DO

## 2017-09-09 ENCOUNTER — Encounter: Payer: Medicaid Other | Admitting: Obstetrics and Gynecology

## 2017-09-09 ENCOUNTER — Ambulatory Visit (INDEPENDENT_AMBULATORY_CARE_PROVIDER_SITE_OTHER): Payer: Medicaid Other | Admitting: Obstetrics and Gynecology

## 2017-09-09 VITALS — BP 126/75 | HR 97 | Wt 263.0 lb

## 2017-09-09 DIAGNOSIS — O09899 Supervision of other high risk pregnancies, unspecified trimester: Secondary | ICD-10-CM

## 2017-09-09 DIAGNOSIS — O24419 Gestational diabetes mellitus in pregnancy, unspecified control: Secondary | ICD-10-CM

## 2017-09-09 DIAGNOSIS — O0990 Supervision of high risk pregnancy, unspecified, unspecified trimester: Secondary | ICD-10-CM

## 2017-09-09 DIAGNOSIS — Z8619 Personal history of other infectious and parasitic diseases: Secondary | ICD-10-CM

## 2017-09-09 NOTE — Patient Instructions (Signed)

## 2017-09-09 NOTE — Progress Notes (Signed)
   PRENATAL VISIT NOTE  Subjective:  Amy Fitzgerald is a 26 y.o. G2P1001 at [redacted]w[redacted]d being seen today for ongoing prenatal care.  She is currently monitored for the following issues for this high-risk pregnancy and has Morbid obesity (HCC); Previous pregnancy complicated by pregnancy-induced hypertension, antepartum; Allergy to pollen; Hypertension; Low vitamin D level; GDM, class A2; History of group B Streptococcus (GBS) infection; and Supervision of high risk pregnancy, antepartum on her problem list.   GDM: Patient taking Glyburide 2.5mg  in am and 5 mg at bedtime.  Reports no hypoglycemic episodes. Tolerating medication well Fasting: 61 - 127, mostly < 100 2hr PP: 59-140s, mostly 110-120s  Patient reports no complaints.  Contractions: Irregular. Vag. Bleeding: None.  Movement: Present. Denies leaking of fluid.   The following portions of the patient's history were reviewed and updated as appropriate: allergies, current medications, past family history, past medical history, past social history, past surgical history and problem list. Problem list updated.  Objective:   Vitals:   09/09/17 1430  BP: 126/75  Pulse: 97  Weight: 263 lb (119.3 kg)    Fetal Status: Fetal Heart Rate (bpm): 160 Fundal Height: 26 cm Movement: Present     General:  Alert, oriented and cooperative. Patient is in no acute distress.  Skin: Skin is warm and dry. No rash noted.   Cardiovascular: Normal heart rate noted  Respiratory: Normal respiratory effort, no problems with respiration noted  Abdomen: Soft, gravid, appropriate for gestational age.  Pain/Pressure: Present     Pelvic: Cervical exam deferred        Extremities: Normal range of motion.  Edema: Trace  Mental Status:  Normal mood and affect. Normal behavior. Normal judgment and thought content.   Assessment and Plan:  Pregnancy: G2P1001 at [redacted]w[redacted]d  1. Supervision of high risk pregnancy, antepartum Declined flu/tdap  2. GDM, class A2 On glyburide 2.5  am and 5 mg QHS - encouraged better diet control and 30 walk - may increase or add metformin if BG remains mildly elevated  3. Previous pregnancy complicated by pregnancy-induced hypertension, antepartum  4. History of group B Streptococcus (GBS) infection txt in labor  5. HTN - normal today  Preterm labor symptoms and general obstetric precautions including but not limited to vaginal bleeding, contractions, leaking of fluid and fetal movement were reviewed in detail with the patient. Please refer to After Visit Summary for other counseling recommendations.  Return in about 2 weeks (around 09/23/2017) for OB visit (MD).   Conan Bowens, MD

## 2017-09-10 LAB — CBC
HEMOGLOBIN: 10.6 g/dL — AB (ref 11.1–15.9)
Hematocrit: 32.2 % — ABNORMAL LOW (ref 34.0–46.6)
MCH: 27.5 pg (ref 26.6–33.0)
MCHC: 32.9 g/dL (ref 31.5–35.7)
MCV: 84 fL (ref 79–97)
Platelets: 271 10*3/uL (ref 150–379)
RBC: 3.85 x10E6/uL (ref 3.77–5.28)
RDW: 14.3 % (ref 12.3–15.4)
WBC: 9 10*3/uL (ref 3.4–10.8)

## 2017-09-10 LAB — HIV ANTIBODY (ROUTINE TESTING W REFLEX): HIV SCREEN 4TH GENERATION: NONREACTIVE

## 2017-09-10 LAB — RPR: RPR: NONREACTIVE

## 2017-09-23 ENCOUNTER — Ambulatory Visit (INDEPENDENT_AMBULATORY_CARE_PROVIDER_SITE_OTHER): Payer: Medicaid Other | Admitting: Obstetrics and Gynecology

## 2017-09-23 VITALS — BP 118/76 | HR 106 | Wt 266.0 lb

## 2017-09-23 DIAGNOSIS — O0990 Supervision of high risk pregnancy, unspecified, unspecified trimester: Secondary | ICD-10-CM

## 2017-09-23 DIAGNOSIS — O24419 Gestational diabetes mellitus in pregnancy, unspecified control: Secondary | ICD-10-CM

## 2017-09-23 DIAGNOSIS — O09899 Supervision of other high risk pregnancies, unspecified trimester: Secondary | ICD-10-CM

## 2017-09-23 DIAGNOSIS — O09893 Supervision of other high risk pregnancies, third trimester: Secondary | ICD-10-CM

## 2017-09-23 DIAGNOSIS — O163 Unspecified maternal hypertension, third trimester: Secondary | ICD-10-CM

## 2017-09-23 DIAGNOSIS — I10 Essential (primary) hypertension: Secondary | ICD-10-CM

## 2017-09-23 DIAGNOSIS — Z8619 Personal history of other infectious and parasitic diseases: Secondary | ICD-10-CM

## 2017-09-23 MED ORDER — ASPIRIN 81 MG PO CHEW: 81.0000 mg | CHEWABLE_TABLET | Freq: Every day | ORAL | 12 refills | Status: DC

## 2017-09-23 MED ORDER — GLYBURIDE 2.5 MG PO TABS: 2.5000 mg | ORAL_TABLET | Freq: Every day | ORAL | 4 refills | Status: DC

## 2017-09-23 NOTE — Progress Notes (Signed)
Subjective:  Amy Fitzgerald is a 26 y.o. G2P1001 at [redacted]w[redacted]d being seen today for ongoing prenatal care.  She is currently monitored for the following issues for this high-risk pregnancy and has Morbid obesity (HCC); Previous pregnancy complicated by pregnancy-induced hypertension, antepartum; Allergy to pollen; Hypertension; Low vitamin D level; GDM, class A2; History of group B Streptococcus (GBS) infection; and Supervision of high risk pregnancy, antepartum on her problem list.  Patient reports no complaints.  Contractions: Irregular. Vag. Bleeding: None.  Movement: Present. Denies leaking of fluid.   The following portions of the patient's history were reviewed and updated as appropriate: allergies, current medications, past family history, past medical history, past social history, past surgical history and problem list. Problem list updated.  Objective:   Vitals:   09/23/17 1618  BP: 118/76  Pulse: (!) 106  Weight: 266 lb (120.7 kg)    Fetal Status: Fetal Heart Rate (bpm): 145   Movement: Present     General:  Alert, oriented and cooperative. Patient is in no acute distress.  Skin: Skin is warm and dry. No rash noted.   Cardiovascular: Normal heart rate noted  Respiratory: Normal respiratory effort, no problems with respiration noted  Abdomen: Soft, gravid, appropriate for gestational age. Pain/Pressure: Present     Pelvic:  Cervical exam deferred        Extremities: Normal range of motion.  Edema: None  Mental Status: Normal mood and affect. Normal behavior. Normal judgment and thought content.   Urinalysis:      Assessment and Plan:  Pregnancy: G2P1001 at [redacted]w[redacted]d  1. Supervision of high risk pregnancy, antepartum Stable  2. Previous pregnancy complicated by pregnancy-induced hypertension, antepartum BP stable without meds Continue with BASA  3. GDM, class A2 BS in gaol range New rx with new regiment sent to pharmacy Will check U/S for growth - US MFM OB FOLLOW UP;  Future  4. History of group B Streptococcus (GBS) infection Tx while in labor   Preterm labor symptoms and general obstetric precautions including but not limited to vaginal bleeding, contractions, leaking of fluid and fetal movement were reviewed in detail with the patient. Please refer to After Visit Summary for other counseling recommendations.  Return in about 1 week (around 09/30/2017) for OB visit.   Hermina StaggersErvin, Michael L, MD

## 2017-09-30 ENCOUNTER — Ambulatory Visit (HOSPITAL_COMMUNITY): Admission: RE | Admit: 2017-09-30 | Payer: Medicaid Other | Source: Ambulatory Visit

## 2017-10-06 ENCOUNTER — Ambulatory Visit (HOSPITAL_COMMUNITY): Admission: RE | Admit: 2017-10-06 | Payer: Medicaid Other | Source: Ambulatory Visit

## 2017-10-13 ENCOUNTER — Ambulatory Visit (HOSPITAL_COMMUNITY)
Admission: RE | Admit: 2017-10-13 | Discharge: 2017-10-13 | Disposition: A | Discharge status: Home / Self Care | Payer: Medicaid Other | Source: Ambulatory Visit | Attending: Obstetrics and Gynecology | Admitting: Obstetrics and Gynecology

## 2017-10-13 ENCOUNTER — Encounter (HOSPITAL_COMMUNITY): Payer: Self-pay

## 2017-10-13 ENCOUNTER — Other Ambulatory Visit (HOSPITAL_COMMUNITY): Payer: Self-pay | Admitting: *Deleted

## 2017-10-13 ENCOUNTER — Other Ambulatory Visit: Payer: Self-pay | Admitting: Obstetrics and Gynecology

## 2017-10-13 ENCOUNTER — Ambulatory Visit (INDEPENDENT_AMBULATORY_CARE_PROVIDER_SITE_OTHER): Payer: Medicaid Other | Admitting: Obstetrics & Gynecology

## 2017-10-13 VITALS — BP 110/77 | HR 87 | Wt 271.0 lb

## 2017-10-13 DIAGNOSIS — Z6841 Body Mass Index (BMI) 40.0 and over, adult: Secondary | ICD-10-CM | POA: Insufficient documentation

## 2017-10-13 DIAGNOSIS — O24410 Gestational diabetes mellitus in pregnancy, diet controlled: Secondary | ICD-10-CM | POA: Diagnosis present

## 2017-10-13 DIAGNOSIS — Z3A32 32 weeks gestation of pregnancy: Secondary | ICD-10-CM

## 2017-10-13 DIAGNOSIS — O24415 Gestational diabetes mellitus in pregnancy, controlled by oral hypoglycemic drugs: Secondary | ICD-10-CM

## 2017-10-13 DIAGNOSIS — O24419 Gestational diabetes mellitus in pregnancy, unspecified control: Secondary | ICD-10-CM

## 2017-10-13 DIAGNOSIS — O99213 Obesity complicating pregnancy, third trimester: Secondary | ICD-10-CM | POA: Insufficient documentation

## 2017-10-13 DIAGNOSIS — O0990 Supervision of high risk pregnancy, unspecified, unspecified trimester: Secondary | ICD-10-CM

## 2017-10-13 NOTE — Progress Notes (Signed)
   PRENATAL VISIT NOTE  Subjective:  Amy Fitzgerald is a 26 y.o. G2P1001 at 4256w2d being seen today for ongoing prenatal care.  She is currently monitored for the following issues for this high-risk pregnancy and has Morbid obesity (HCC); Previous pregnancy complicated by pregnancy-induced hypertension, antepartum; Allergy to pollen; Hypertension; Low vitamin D level; GDM, class A2; History of group B Streptococcus (GBS) infection; and Supervision of high risk pregnancy, antepartum on their problem list.  Patient reports no complaints.  Contractions: Irregular. Vag. Bleeding: None.  Movement: Present. Denies leaking of fluid.   The following portions of the patient's history were reviewed and updated as appropriate: allergies, current medications, past family history, past medical history, past social history, past surgical history and problem list. Problem list updated.  Objective:   Vitals:   10/13/17 1403  BP: 110/77  Pulse: 87  Weight: 271 lb (122.9 kg)    Fetal Status:     Movement: Present     General:  Alert, oriented and cooperative. Patient is in no acute distress.  Skin: Skin is warm and dry. No rash noted.   Cardiovascular: Normal heart rate noted  Respiratory: Normal respiratory effort, no problems with respiration noted  Abdomen: Soft, gravid, appropriate for gestational age.  Pain/Pressure: Present     Pelvic: Cervical exam deferred        Extremities: Normal range of motion.  Edema: None  Mental Status:  Normal mood and affect. Normal behavior. Normal judgment and thought content.   Assessment and Plan:  Pregnancy: G2P1001 at 3256w2d  1. Supervision of high risk pregnancy, antepartum growth US done and increased AC seen  2. GDM, class A2 Needs NST today  Preterm labor symptoms and general obstetric precautions including but not limited to vaginal bleeding, contractions, leaking of fluid and fetal movement were reviewed in detail with the patient. Please refer to After  Visit Summary for other counseling recommendations.  Return in about 1 week (around 10/20/2017) for NST.   Scheryl DarterJames Draper Gallon, MD

## 2017-10-13 NOTE — Patient Instructions (Signed)

## 2017-10-21 ENCOUNTER — Ambulatory Visit (INDEPENDENT_AMBULATORY_CARE_PROVIDER_SITE_OTHER): Payer: Medicaid Other | Admitting: Obstetrics & Gynecology

## 2017-10-21 ENCOUNTER — Encounter: Payer: Self-pay | Admitting: Obstetrics & Gynecology

## 2017-10-21 ENCOUNTER — Other Ambulatory Visit: Payer: Medicaid Other

## 2017-10-21 VITALS — BP 116/81 | HR 106 | Wt 271.0 lb

## 2017-10-21 DIAGNOSIS — O0990 Supervision of high risk pregnancy, unspecified, unspecified trimester: Secondary | ICD-10-CM

## 2017-10-21 DIAGNOSIS — O24415 Gestational diabetes mellitus in pregnancy, controlled by oral hypoglycemic drugs: Secondary | ICD-10-CM | POA: Diagnosis not present

## 2017-10-21 DIAGNOSIS — O09293 Supervision of pregnancy with other poor reproductive or obstetric history, third trimester: Secondary | ICD-10-CM

## 2017-10-21 DIAGNOSIS — O09299 Supervision of pregnancy with other poor reproductive or obstetric history, unspecified trimester: Secondary | ICD-10-CM

## 2017-10-21 DIAGNOSIS — O0993 Supervision of high risk pregnancy, unspecified, third trimester: Secondary | ICD-10-CM

## 2017-10-21 MED ORDER — ACCU-CHEK GUIDE W/DEVICE KIT: 1.0000 | PACK | Freq: Four times a day (QID) | 0 refills | Status: DC

## 2017-10-21 MED ORDER — GLYBURIDE 5 MG PO TABS: 5.0000 mg | ORAL_TABLET | Freq: Two times a day (BID) | ORAL | 3 refills | Status: DC

## 2017-10-21 NOTE — Progress Notes (Signed)
Pt denies complaints at this time

## 2017-10-21 NOTE — Progress Notes (Signed)
   PRENATAL VISIT NOTE  Subjective:  Amy Fitzgerald is a 26 y.o. G2P1001 at 82w3dbeing seen today for ongoing prenatal care.  She is currently monitored for the following issues for this high-risk pregnancy and has Morbid obesity ([place]; History of pre-eclampsia in prior pregnancy, currently pregnant; Low vitamin D level; GDM, class A2; History of group B Streptococcus (GBS) infection; and Supervision of high risk pregnancy, antepartum on their problem list.  Patient reports no complaints. Meter died two days ago.   Contractions: Irregular. Vag. Bleeding: None.  Movement: Present. Denies leaking of fluid.   The following portions of the patient's history were reviewed and updated as appropriate: allergies, current medications, past family history, past medical history, past social history, past surgical history and problem list. Problem list updated.  Objective:   Vitals:   10/21/17 1447  BP: 116/81  Pulse: (!) 106  Weight: 271 lb (122.9 kg)    Fetal Status: Fetal Heart Rate (bpm): 156 Fundal Height: 33 cm Movement: Present     General:  Alert, oriented and cooperative. Patient is in no acute distress.  Skin: Skin is warm and dry. No rash noted.   Cardiovascular: Normal heart rate noted  Respiratory: Normal respiratory effort, no problems with respiration noted  Abdomen: Soft, gravid, appropriate for gestational age.  Pain/Pressure: Present     Pelvic: Cervical exam deferred        Extremities: Normal range of motion.  Edema: Trace  Mental Status:  Normal mood and affect. Normal behavior. Normal judgment and thought content.     Assessment and Plan:  Pregnancy: G2P1001 at 357w3d1. Oral hypoglycemic controlled White classification A2 gestational diabetes mellitus (GDM) On review of BS, she has elevated lunch PP up to 170s. Normal fasting. Slightly elevated dinner PP. No breakfast PP recorded (does not eat breakfast). Meter stopped working two days, change of batteries/charging did not  help. Glyburide increased to 5 mg bid, new meter prescribed. Will continue to monitor closely.  NST performed today was reviewed and was found to be reactive.  AFI was also normal at 9.8 cm.  Continue antenatal testing as recommended. BPP added onto already scheduled growth scan on 11/10/17. - Fetal nonstress test - [employer] - glyBURIDE (DIABETA) 5 MG tablet; Take 1 tablet (5 mg total) 2 (two) times daily with a meal by mouth.  Dispense: 60 tablet; Refill: 3 - [employer] FETAL BPP WO NON STRESS; Future - Blood Glucose Monitoring Suppl (ACCU-CHEK GUIDE) w/Device KIT   2. History of pre-eclampsia in prior pregnancy, currently pregnant Stable BP, continue ASA  3. Supervision of high risk pregnancy, antepartum Considering BTS, papers signed today.  Preterm labor symptoms and general obstetric precautions including but not limited to vaginal bleeding, contractions, leaking of fluid and fetal movement were reviewed in detail with the patient. Please refer to After Visit Summary for other counseling recommendations.  Return in about 1 week (around 10/28/2017); needs twice a week testing, weekly OB MD visits.   UgVerita SchneidersMD

## 2017-10-21 NOTE — Patient Instructions (Signed)
Return to clinic for any scheduled appointments or obstetric concerns, or go to [facility] for evaluation  

## 2017-10-22 ENCOUNTER — Other Ambulatory Visit: Payer: Self-pay | Admitting: Obstetrics

## 2017-10-22 ENCOUNTER — Other Ambulatory Visit: Payer: Self-pay

## 2017-10-22 DIAGNOSIS — O0993 Supervision of high risk pregnancy, unspecified, third trimester: Secondary | ICD-10-CM

## 2017-10-22 DIAGNOSIS — O24419 Gestational diabetes mellitus in pregnancy, unspecified control: Secondary | ICD-10-CM

## 2017-10-25 ENCOUNTER — Ambulatory Visit (INDEPENDENT_AMBULATORY_CARE_PROVIDER_SITE_OTHER): Payer: Medicaid Other | Admitting: Obstetrics and Gynecology

## 2017-10-25 ENCOUNTER — Encounter: Payer: Self-pay | Admitting: Obstetrics and Gynecology

## 2017-10-25 VITALS — BP 133/83 | HR 106 | Wt 270.0 lb

## 2017-10-25 DIAGNOSIS — O0990 Supervision of high risk pregnancy, unspecified, unspecified trimester: Secondary | ICD-10-CM

## 2017-10-25 DIAGNOSIS — O09299 Supervision of pregnancy with other poor reproductive or obstetric history, unspecified trimester: Secondary | ICD-10-CM

## 2017-10-25 DIAGNOSIS — O0993 Supervision of high risk pregnancy, unspecified, third trimester: Secondary | ICD-10-CM | POA: Diagnosis not present

## 2017-10-25 DIAGNOSIS — O09293 Supervision of pregnancy with other poor reproductive or obstetric history, third trimester: Secondary | ICD-10-CM

## 2017-10-25 DIAGNOSIS — O24419 Gestational diabetes mellitus in pregnancy, unspecified control: Secondary | ICD-10-CM

## 2017-10-25 NOTE — Progress Notes (Signed)
   PRENATAL VISIT NOTE  Subjective:  Amy Fitzgerald is a 26 y.o. G2P1001 at 5953w0d being seen today for ongoing prenatal care.  She is currently monitored for the following issues for this high-risk pregnancy and has Morbid obesity (HCC); History of pre-eclampsia in prior pregnancy, currently pregnant; Low vitamin D level; GDM, class A2; History of group B Streptococcus (GBS) infection; and Supervision of high risk pregnancy, antepartum on their problem list.  Patient reports no complaints.  Contractions: Irregular. Vag. Bleeding: None.  Movement: Present. Denies leaking of fluid.   The following portions of the patient's history were reviewed and updated as appropriate: allergies, current medications, past family history, past medical history, past social history, past surgical history and problem list. Problem list updated.  Objective:   Vitals:   10/25/17 1425  BP: 133/83  Pulse: (!) 106  Weight: 270 lb (122.5 kg)    Fetal Status: Fetal Heart Rate (bpm): NST    Movement: Present     General:  Alert, oriented and cooperative. Patient is in no acute distress.  Skin: Skin is warm and dry. No rash noted.   Cardiovascular: Normal heart rate noted  Respiratory: Normal respiratory effort, no problems with respiration noted  Abdomen: Soft, gravid, appropriate for gestational age.  Pain/Pressure: Present     Pelvic: Cervical exam deferred        Extremities: Normal range of motion.  Edema: Trace  Mental Status:  Normal mood and affect. Normal behavior. Normal judgment and thought content.   Assessment and Plan:  Pregnancy: G2P1001 at 7353w0d  1. Supervision of high risk pregnancy, antepartum Patient is doing well without complaints  2. GDM, class A2 Patient was not able to check CBG this past week as her meter was broken. She is receiving a replacement today Previous week CBG reviewed and patient admits to not always adhering to diet - Fetal nonstress test- baseline 150, mod variability,  +accels, no decels BPP tomorrow Follow up growth 12/5 Plan for IOL at 39 weeks  3. History of pre-eclampsia in prior pregnancy, currently pregnant Continue ASA  Preterm labor symptoms and general obstetric precautions including but not limited to vaginal bleeding, contractions, leaking of fluid and fetal movement were reviewed in detail with the patient. Please refer to After Visit Summary for other counseling recommendations.  Return in about 1 week (around 11/01/2017) for ROB.   Catalina AntiguaPeggy Simmie Camerer, MD

## 2017-10-27 ENCOUNTER — Other Ambulatory Visit: Payer: Self-pay | Admitting: Obstetrics & Gynecology

## 2017-10-27 ENCOUNTER — Ambulatory Visit (HOSPITAL_COMMUNITY)
Admission: RE | Admit: 2017-10-27 | Discharge: 2017-10-27 | Disposition: A | Discharge status: Home / Self Care | Payer: Medicaid Other | Source: Ambulatory Visit | Attending: Obstetrics & Gynecology | Admitting: Obstetrics & Gynecology

## 2017-10-27 ENCOUNTER — Encounter (HOSPITAL_COMMUNITY): Payer: Self-pay

## 2017-10-27 DIAGNOSIS — Z3A34 34 weeks gestation of pregnancy: Secondary | ICD-10-CM | POA: Insufficient documentation

## 2017-10-27 DIAGNOSIS — O0993 Supervision of high risk pregnancy, unspecified, third trimester: Secondary | ICD-10-CM

## 2017-10-27 DIAGNOSIS — O24415 Gestational diabetes mellitus in pregnancy, controlled by oral hypoglycemic drugs: Secondary | ICD-10-CM

## 2017-11-01 ENCOUNTER — Ambulatory Visit (INDEPENDENT_AMBULATORY_CARE_PROVIDER_SITE_OTHER): Payer: Medicaid Other

## 2017-11-01 DIAGNOSIS — O24419 Gestational diabetes mellitus in pregnancy, unspecified control: Secondary | ICD-10-CM | POA: Diagnosis not present

## 2017-11-01 NOTE — Progress Notes (Signed)
Nurse visit for NST only dx: GDM. NST R per Dr. Alysia PennaErvin.

## 2017-11-04 ENCOUNTER — Other Ambulatory Visit: Payer: Medicaid Other

## 2017-11-04 ENCOUNTER — Encounter: Payer: Self-pay | Admitting: Obstetrics and Gynecology

## 2017-11-04 ENCOUNTER — Other Ambulatory Visit (HOSPITAL_COMMUNITY)
Admission: RE | Admit: 2017-11-04 | Discharge: 2017-11-04 | Disposition: A | Discharge status: Home / Self Care | Payer: Medicaid Other | Source: Ambulatory Visit | Attending: Obstetrics and Gynecology | Admitting: Obstetrics and Gynecology

## 2017-11-04 ENCOUNTER — Ambulatory Visit (INDEPENDENT_AMBULATORY_CARE_PROVIDER_SITE_OTHER): Payer: Medicaid Other | Admitting: Obstetrics and Gynecology

## 2017-11-04 VITALS — BP 120/83 | HR 101 | Wt 276.4 lb

## 2017-11-04 DIAGNOSIS — O0990 Supervision of high risk pregnancy, unspecified, unspecified trimester: Secondary | ICD-10-CM | POA: Insufficient documentation

## 2017-11-04 DIAGNOSIS — O24419 Gestational diabetes mellitus in pregnancy, unspecified control: Secondary | ICD-10-CM

## 2017-11-04 DIAGNOSIS — O09299 Supervision of pregnancy with other poor reproductive or obstetric history, unspecified trimester: Secondary | ICD-10-CM

## 2017-11-04 DIAGNOSIS — Z8619 Personal history of other infectious and parasitic diseases: Secondary | ICD-10-CM

## 2017-11-04 LAB — OB RESULTS CONSOLE GC/CHLAMYDIA: Gonorrhea: NEGATIVE

## 2017-11-04 NOTE — Addendum Note (Signed)
Addended by: Natale MilchSTALLING, BRITTANY D on: 11/04/2017 03:25 PM   Modules accepted: Orders

## 2017-11-04 NOTE — Progress Notes (Signed)
NST DX GDM 

## 2017-11-05 LAB — GC/CHLAMYDIA PROBE AMP (~~LOC~~) NOT AT ARMC
CHLAMYDIA, DNA PROBE: NEGATIVE
Neisseria Gonorrhea: NEGATIVE

## 2017-11-05 NOTE — Progress Notes (Signed)
Subjective:  Amy Fitzgerald is a 26 y.o. G2P1001 at 10122w4d being seen today for ongoing prenatal care.  She is currently monitored for the following issues for this high-risk pregnancy and has Morbid obesity (HCC); History of pre-eclampsia in prior pregnancy, currently pregnant; Low vitamin D level; GDM, class A2; History of group B Streptococcus (GBS) infection; and Supervision of high risk pregnancy, antepartum on their problem list.  Patient reports no complaints.  Contractions: Irregular. Vag. Bleeding: None.  Movement: Present. Denies leaking of fluid.   The following portions of the patient's history were reviewed and updated as appropriate: allergies, current medications, past family history, past medical history, past social history, past surgical history and problem list. Problem list updated.  Objective:   Vitals:   11/04/17 1409  BP: 120/83  Pulse: (!) 101  Weight: 125.4 kg (276 lb 6.4 oz)    Fetal Status: Fetal Heart Rate (bpm): NST   Movement: Present     General:  Alert, oriented and cooperative. Patient is in no acute distress.  Skin: Skin is warm and dry. No rash noted.   Cardiovascular: Normal heart rate noted  Respiratory: Normal respiratory effort, no problems with respiration noted  Abdomen: Soft, gravid, appropriate for gestational age. Pain/Pressure: Present     Pelvic:  Cervical exam performed        Extremities: Normal range of motion.  Edema: Trace  Mental Status: Normal mood and affect. Normal behavior. Normal judgment and thought content.   Urinalysis:      Assessment and Plan:  Pregnancy: G2P1001 at 4522w4d  1. Supervision of high risk pregnancy, antepartum Stable - Fetal nonstress test - GC[facility]Chlamydia [facility] amp [facility]  2. GDM, class A2 Continue with diet and Diabeta Continue with antenatal testing Reactive NST and AFI 8.5 today - Fetal nonstress test  3. History of group B Streptococcus (GBS) infection Tx while in labor  4. History  of pre-eclampsia in prior pregnancy, currently pregnant BP stable   Preterm labor symptoms and general obstetric precautions including but not limited to vaginal bleeding, contractions, leaking of fluid and fetal movement were reviewed in detail with the patient. Please refer to After Visit Summary for other counseling recommendations.  Return in about 1 week (around 11/11/2017) for OB visit.   Hermina StaggersErvin, Vernie Piet L, MD

## 2017-11-08 ENCOUNTER — Ambulatory Visit (INDEPENDENT_AMBULATORY_CARE_PROVIDER_SITE_OTHER): Payer: Medicaid Other | Admitting: Obstetrics and Gynecology

## 2017-11-08 ENCOUNTER — Encounter: Payer: Self-pay | Admitting: Obstetrics and Gynecology

## 2017-11-08 VITALS — BP 128/85 | HR 94 | Wt 278.4 lb

## 2017-11-08 DIAGNOSIS — O24419 Gestational diabetes mellitus in pregnancy, unspecified control: Secondary | ICD-10-CM | POA: Diagnosis not present

## 2017-11-08 DIAGNOSIS — O0990 Supervision of high risk pregnancy, unspecified, unspecified trimester: Secondary | ICD-10-CM

## 2017-11-08 DIAGNOSIS — Z3A36 36 weeks gestation of pregnancy: Secondary | ICD-10-CM | POA: Diagnosis not present

## 2017-11-08 DIAGNOSIS — O0993 Supervision of high risk pregnancy, unspecified, third trimester: Secondary | ICD-10-CM

## 2017-11-08 DIAGNOSIS — O09293 Supervision of pregnancy with other poor reproductive or obstetric history, third trimester: Secondary | ICD-10-CM

## 2017-11-08 DIAGNOSIS — O09299 Supervision of pregnancy with other poor reproductive or obstetric history, unspecified trimester: Secondary | ICD-10-CM

## 2017-11-08 DIAGNOSIS — Z8619 Personal history of other infectious and parasitic diseases: Secondary | ICD-10-CM

## 2017-11-08 NOTE — Progress Notes (Signed)
Pt presents for ROB/NST dx: GDM.

## 2017-11-08 NOTE — Progress Notes (Signed)
   PRENATAL VISIT NOTE  Subjective:  Amy Fitzgerald is a 26 y.o. G2P1001 at 4053w0d being seen today for ongoing prenatal care.  She is currently monitored for the following issues for this high-risk pregnancy and has Morbid obesity (HCC); History of pre-eclampsia in prior pregnancy, currently pregnant; Low vitamin D level; GDM, class A2; History of group B Streptococcus (GBS) infection; and Supervision of high risk pregnancy, antepartum on their problem list.  Patient reports no complaints.  Contractions: Irregular. Vag. Bleeding: None.  Movement: Present. Denies leaking of fluid.   The following portions of the patient's history were reviewed and updated as appropriate: allergies, current medications, past family history, past medical history, past social history, past surgical history and problem list. Problem list updated.  Objective:   Vitals:   11/08/17 1500  BP: 128/85  Pulse: 94  Weight: 278 lb 6.4 oz (126.3 kg)    Fetal Status: Fetal Heart Rate (bpm): NST   Movement: Present     General:  Alert, oriented and cooperative. Patient is in no acute distress.  Skin: Skin is warm and dry. No rash noted.   Cardiovascular: Normal heart rate noted  Respiratory: Normal respiratory effort, no problems with respiration noted  Abdomen: Soft, gravid, appropriate for gestational age.  Pain/Pressure: Present     Pelvic: Cervical exam deferred        Extremities: Normal range of motion.  Edema: Trace  Mental Status:  Normal mood and affect. Normal behavior. Normal judgment and thought content.   Assessment and Plan:  Pregnancy: G2P1001 at 3553w0d  1. Supervision of high risk pregnancy, antepartum Patient is doing well without complaints   2. GDM, class A2 CBGs reviewed and fasting within range. Pp most are normal but some elevated as high as 160. Patient admits to not always adhering to the diet Continue glyburide Continue ASA Follow up growth 12/5 NST reviewed and reactive with baseline  140, mod variability, +accels, no decels - Fetal nonstress test  3. History of pre-eclampsia in prior pregnancy, currently pregnant Normotensive and asymptomatic  Preterm labor symptoms and general obstetric precautions including but not limited to vaginal bleeding, contractions, leaking of fluid and fetal movement were reviewed in detail with the patient. Please refer to After Visit Summary for other counseling recommendations.  No Follow-up on file.   Catalina AntiguaPeggy Waylon Hershey, MD

## 2017-11-10 ENCOUNTER — Ambulatory Visit (HOSPITAL_COMMUNITY)
Admission: RE | Admit: 2017-11-10 | Discharge: 2017-11-10 | Disposition: A | Discharge status: Home / Self Care | Payer: Medicaid Other | Source: Ambulatory Visit | Attending: Obstetrics and Gynecology | Admitting: Obstetrics and Gynecology

## 2017-11-10 ENCOUNTER — Other Ambulatory Visit (HOSPITAL_COMMUNITY): Payer: Self-pay | Admitting: Maternal and Fetal Medicine

## 2017-11-10 ENCOUNTER — Other Ambulatory Visit: Payer: Self-pay | Admitting: Obstetrics & Gynecology

## 2017-11-10 ENCOUNTER — Encounter (HOSPITAL_COMMUNITY): Payer: Self-pay

## 2017-11-10 DIAGNOSIS — Z3A36 36 weeks gestation of pregnancy: Secondary | ICD-10-CM

## 2017-11-10 DIAGNOSIS — O24415 Gestational diabetes mellitus in pregnancy, controlled by oral hypoglycemic drugs: Secondary | ICD-10-CM | POA: Diagnosis not present

## 2017-11-10 DIAGNOSIS — O99213 Obesity complicating pregnancy, third trimester: Secondary | ICD-10-CM | POA: Insufficient documentation

## 2017-11-15 ENCOUNTER — Other Ambulatory Visit: Payer: Medicaid Other

## 2017-11-15 ENCOUNTER — Encounter: Payer: Medicaid Other | Admitting: Obstetrics and Gynecology

## 2017-11-18 ENCOUNTER — Encounter: Payer: Self-pay | Admitting: Obstetrics & Gynecology

## 2017-11-18 ENCOUNTER — Other Ambulatory Visit: Payer: Medicaid Other

## 2017-11-18 ENCOUNTER — Encounter (HOSPITAL_COMMUNITY): Payer: Self-pay | Admitting: *Deleted

## 2017-11-18 ENCOUNTER — Ambulatory Visit (INDEPENDENT_AMBULATORY_CARE_PROVIDER_SITE_OTHER): Payer: Medicaid Other | Admitting: Obstetrics & Gynecology

## 2017-11-18 ENCOUNTER — Telehealth (HOSPITAL_COMMUNITY): Payer: Self-pay | Admitting: *Deleted

## 2017-11-18 VITALS — BP 123/82 | HR 93 | Wt 277.2 lb

## 2017-11-18 DIAGNOSIS — O24415 Gestational diabetes mellitus in pregnancy, controlled by oral hypoglycemic drugs: Secondary | ICD-10-CM

## 2017-11-18 DIAGNOSIS — O0990 Supervision of high risk pregnancy, unspecified, unspecified trimester: Secondary | ICD-10-CM

## 2017-11-18 DIAGNOSIS — O0993 Supervision of high risk pregnancy, unspecified, third trimester: Secondary | ICD-10-CM

## 2017-11-18 DIAGNOSIS — O99820 Streptococcus B carrier state complicating pregnancy: Secondary | ICD-10-CM

## 2017-11-18 NOTE — Telephone Encounter (Signed)
Preadmission screen  

## 2017-11-18 NOTE — Progress Notes (Signed)
   PRENATAL VISIT NOTE  Subjective:  Amy Fitzgerald is a 26 y.o. G2P1001 at 6511w3d being seen today for ongoing prenatal care.  She is currently monitored for the following issues for this high-risk pregnancy and has Morbid obesity (HCC); History of pre-eclampsia in prior pregnancy, currently pregnant; Low vitamin D level; GDM, class A2; and Supervision of high risk pregnancy, antepartum on their problem list.  Patient reports no complaints.  Contractions: Irregular. Vag. Bleeding: None.  Movement: Present. Denies leaking of fluid.   The following portions of the patient's history were reviewed and updated as appropriate: allergies, current medications, past family history, past medical history, past social history, past surgical history and problem list. Problem list updated.  Objective:   Vitals:   11/18/17 0844  BP: 123/82  Pulse: 93  Weight: 277 lb 3.2 oz (125.7 kg)    Fetal Status: Fetal Heart Rate (bpm): NST   Movement: Present     General:  Alert, oriented and cooperative. Patient is in no acute distress.  Skin: Skin is warm and dry. No rash noted.   Cardiovascular: Normal heart rate noted  Respiratory: Normal respiratory effort, no problems with respiration noted  Abdomen: Soft, gravid, appropriate for gestational age.  Pain/Pressure: Present     Pelvic: Cervical exam performed        Extremities: Normal range of motion.  Edema: Trace  Mental Status:  Normal mood and affect. Normal behavior. Normal judgment and thought content.   Assessment and Plan:  Pregnancy: G2P1001 at 4211w3d  1. Supervision of high risk pregnancy, antepartum GBS done, no testing this pregnancy, had negative urine culture  2. Oral hypoglycemic controlled White classification A2 gestational diabetes mellitus (GDM) rx NST - Fetal nonstress test - US OB Limited; Future  Term labor symptoms and general obstetric precautions including but not limited to vaginal bleeding, contractions, leaking of fluid and  fetal movement were reviewed in detail with the patient. Please refer to After Visit Summary for other counseling recommendations.  No Follow-up on file. IOL 39 weeks  Scheryl DarterJames Lachlyn Vanderstelt, MD

## 2017-11-18 NOTE — Patient Instructions (Signed)
Labor Induction Labor induction is when steps are taken to cause a pregnant woman to begin the labor process. Most women go into labor on their own between 37 weeks and 42 weeks of the pregnancy. When this does not happen or when there is a medical need, methods may be used to induce labor. Labor induction causes a pregnant woman's uterus to contract. It also causes the cervix to soften (ripen), open (dilate), and thin out (efface). Usually, labor is not induced before 39 weeks of the pregnancy unless there is a problem with the baby or mother. Before inducing labor, your health care provider will consider a number of factors, including the following:  The medical condition of you and the baby.  How many weeks along you are.  The status of the baby's lung maturity.  The condition of the cervix.  The position of the baby. What are the reasons for labor induction? Labor may be induced for the following reasons:  The health of the baby or mother is at risk.  The pregnancy is overdue by 1 week or more.  The water breaks but labor does not start on its own.  The mother has a health condition or serious illness, such as high blood pressure, infection, placental abruption, or diabetes.  The amniotic fluid amounts are low around the baby.  The baby is distressed. Convenience or wanting the baby to be born on a certain date is not a reason for inducing labor. What methods are used for labor induction? Several methods of labor induction may be used, such as:  Prostaglandin medicine. This medicine causes the cervix to dilate and ripen. The medicine will also start contractions. It can be taken by mouth or by inserting a suppository into the vagina.  Inserting a thin tube (catheter) with a balloon on the end into the vagina to dilate the cervix. Once inserted, the balloon is expanded with water, which causes the cervix to open.  Stripping the membranes. Your health care provider separates  amniotic sac tissue from the cervix, causing the cervix to be stretched and causing the release of a hormone called progesterone. This may cause the uterus to contract. It is often done during an office visit. You will be sent home to wait for the contractions to begin. You will then come in for an induction.  Breaking the water. Your health care provider makes a hole in the amniotic sac using a small instrument. Once the amniotic sac breaks, contractions should begin. This may still take hours to see an effect.  Medicine to trigger or strengthen contractions. This medicine is given through an IV access tube inserted into a vein in your arm. All of the methods of induction, besides stripping the membranes, will be done in the hospital. Induction is done in the hospital so that you and the baby can be carefully monitored. How long does it take for labor to be induced? Some inductions can take up to 2-3 days. Depending on the cervix, it usually takes less time. It takes longer when you are induced early in the pregnancy or if this is your first pregnancy. If a mother is still pregnant and the induction has been going on for 2-3 days, either the mother will be sent home or a cesarean delivery will be needed. What are the risks associated with labor induction? Some of the risks of induction include:  Changes in fetal heart rate, such as too high, too low, or erratic.  Fetal distress.    Chance of infection for the mother and baby.  Increased chance of having a cesarean delivery.  Breaking off (abruption) of the placenta from the uterus (rare).  Uterine rupture (very rare). When induction is needed for medical reasons, the benefits of induction may outweigh the risks. What are some reasons for not inducing labor? Labor induction should not be done if:  It is shown that your baby does not tolerate labor.  You have had previous surgeries on your uterus, such as a myomectomy or the removal of  fibroids.  Your placenta lies very low in the uterus and blocks the opening of the cervix (placenta previa).  Your baby is not in a head-down position.  The umbilical cord drops down into the birth canal in front of the baby. This could cut off the baby's blood and oxygen supply.  You have had a previous cesarean delivery.  There are unusual circumstances, such as the baby being extremely premature. This information is not intended to replace advice given to you by your health care provider. Make sure you discuss any questions you have with your health care provider. Document Released: 04/14/2007 Document Revised: 04/30/2016 Document Reviewed: 06/22/2013 Elsevier Interactive Patient Education  2017 [employer]  

## 2017-11-18 NOTE — Progress Notes (Signed)
Patient reports good fetal movement with pressure and irregular contractions. 

## 2017-11-20 LAB — STREP GP B NAA: Strep Gp B NAA: POSITIVE — AB

## 2017-11-21 DIAGNOSIS — O99820 Streptococcus B carrier state complicating pregnancy: Secondary | ICD-10-CM | POA: Insufficient documentation

## 2017-11-22 ENCOUNTER — Ambulatory Visit (INDEPENDENT_AMBULATORY_CARE_PROVIDER_SITE_OTHER): Payer: Medicaid Other | Admitting: Obstetrics and Gynecology

## 2017-11-22 ENCOUNTER — Encounter: Payer: Self-pay | Admitting: Obstetrics and Gynecology

## 2017-11-22 ENCOUNTER — Other Ambulatory Visit: Payer: Medicaid Other

## 2017-11-22 VITALS — BP 119/82 | HR 94 | Wt 276.0 lb

## 2017-11-22 DIAGNOSIS — O99820 Streptococcus B carrier state complicating pregnancy: Secondary | ICD-10-CM

## 2017-11-22 DIAGNOSIS — O0993 Supervision of high risk pregnancy, unspecified, third trimester: Secondary | ICD-10-CM

## 2017-11-22 DIAGNOSIS — O09293 Supervision of pregnancy with other poor reproductive or obstetric history, third trimester: Secondary | ICD-10-CM

## 2017-11-22 DIAGNOSIS — O24419 Gestational diabetes mellitus in pregnancy, unspecified control: Secondary | ICD-10-CM

## 2017-11-22 DIAGNOSIS — O0990 Supervision of high risk pregnancy, unspecified, unspecified trimester: Secondary | ICD-10-CM

## 2017-11-22 DIAGNOSIS — O09299 Supervision of pregnancy with other poor reproductive or obstetric history, unspecified trimester: Secondary | ICD-10-CM

## 2017-11-22 NOTE — Progress Notes (Signed)
   PRENATAL VISIT NOTE  Subjective:  Amy Fitzgerald is a 26 y.o. G2P1001 at 452w0d being seen today for ongoing prenatal care.  She is currently monitored for the following issues for this high-risk pregnancy and has Morbid obesity (HCC); History of pre-eclampsia in prior pregnancy, currently pregnant; Low vitamin D level; GDM, class A2; Supervision of high risk pregnancy, antepartum; and GBS (group B Streptococcus carrier), +RV culture, currently pregnant on their problem list.  Patient reports no complaints.  Contractions: Irregular. Vag. Bleeding: None.  Movement: Present. Denies leaking of fluid.   The following portions of the patient's history were reviewed and updated as appropriate: allergies, current medications, past family history, past medical history, past social history, past surgical history and problem list. Problem list updated.  Objective:   Vitals:   11/22/17 1328  BP: 119/82  Pulse: 94  Weight: 276 lb (125.2 kg)    Fetal Status: Fetal Heart Rate (bpm): NST   Movement: Present     General:  Alert, oriented and cooperative. Patient is in no acute distress.  Skin: Skin is warm and dry. No rash noted.   Cardiovascular: Normal heart rate noted  Respiratory: Normal respiratory effort, no problems with respiration noted  Abdomen: Soft, gravid, appropriate for gestational age.  Pain/Pressure: Present     Pelvic: Cervical exam deferred        Extremities: Normal range of motion.  Edema: None  Mental Status:  Normal mood and affect. Normal behavior. Normal judgment and thought content.   Assessment and Plan:  Pregnancy: G2P1001 at 8252w0d  1. GDM, class A2 CBGs reviewed and majority within range Continue glyburide at current dosage IOL scheduled on 12/24 EFW 12/5 7lb13oz with AC>90%tile- discussed associated risk of shoulder dystocia and its maternal/fetal implication. Patient with previous 7lb15oz delivery - Fetal nonstress test- NST reviewed and reactive with baseline 140,  mod variability, +accels, no decels - US OB Limited; Future  2. Supervision of high risk pregnancy, antepartum Patient is doing well without complaints  3. GBS (group B Streptococcus carrier), +RV culture, currently pregnant Will provide prophylaxis in labor  4. History of pre-eclampsia in prior pregnancy, currently pregnant Normotensive and asymptomatic S/P ASA  Term labor symptoms and general obstetric precautions including but not limited to vaginal bleeding, contractions, leaking of fluid and fetal movement were reviewed in detail with the patient. Please refer to After Visit Summary for other counseling recommendations.  Return in about 6 weeks (around 01/03/2018) for postpartum visit.   Catalina AntiguaPeggy Jozalyn Baglio, MD

## 2017-11-25 ENCOUNTER — Ambulatory Visit (INDEPENDENT_AMBULATORY_CARE_PROVIDER_SITE_OTHER): Payer: Medicaid Other

## 2017-11-25 VITALS — BP 129/85 | HR 78 | Wt 281.0 lb

## 2017-11-25 DIAGNOSIS — O24419 Gestational diabetes mellitus in pregnancy, unspecified control: Secondary | ICD-10-CM | POA: Diagnosis not present

## 2017-11-29 ENCOUNTER — Encounter (HOSPITAL_COMMUNITY): Payer: Self-pay | Admitting: Anesthesiology

## 2017-11-29 ENCOUNTER — Encounter (HOSPITAL_COMMUNITY): Payer: Self-pay

## 2017-11-29 ENCOUNTER — Inpatient Hospital Stay (HOSPITAL_COMMUNITY)
Admission: RE | Admit: 2017-11-29 | Discharge: 2017-12-01 | DRG: 807 | Disposition: A | Discharge status: Home / Self Care | Payer: Medicaid Other | Source: Ambulatory Visit | Attending: Obstetrics & Gynecology | Admitting: Obstetrics & Gynecology

## 2017-11-29 DIAGNOSIS — Z3A39 39 weeks gestation of pregnancy: Secondary | ICD-10-CM | POA: Diagnosis not present

## 2017-11-29 DIAGNOSIS — O24425 Gestational diabetes mellitus in childbirth, controlled by oral hypoglycemic drugs: Secondary | ICD-10-CM | POA: Diagnosis present

## 2017-11-29 DIAGNOSIS — O99824 Streptococcus B carrier state complicating childbirth: Secondary | ICD-10-CM | POA: Diagnosis present

## 2017-11-29 LAB — TYPE AND SCREEN
ABO/RH(D): A POS
Antibody Screen: NEGATIVE

## 2017-11-29 LAB — CBC
HCT: 33.9 % — ABNORMAL LOW (ref 36.0–46.0)
HEMOGLOBIN: 11 g/dL — AB (ref 12.0–15.0)
MCH: 27.3 pg (ref 26.0–34.0)
MCHC: 32.4 g/dL (ref 30.0–36.0)
MCV: 84.1 fL (ref 78.0–100.0)
Platelets: 227 10*3/uL (ref 150–400)
RBC: 4.03 MIL/uL (ref 3.87–5.11)
RDW: 14.3 % (ref 11.5–15.5)
WBC: 7.7 10*3/uL (ref 4.0–10.5)

## 2017-11-29 LAB — RPR: RPR: NONREACTIVE

## 2017-11-29 LAB — GLUCOSE, CAPILLARY
GLUCOSE-CAPILLARY: 96 mg/dL (ref 65–99)
GLUCOSE-CAPILLARY: 71 mg/dL (ref 65–99)
Glucose-Capillary: 111 mg/dL — ABNORMAL HIGH (ref 65–99)
Glucose-Capillary: 105 mg/dL — ABNORMAL HIGH (ref 65–99)

## 2017-11-29 MED ORDER — FENTANYL CITRATE (PF) 100 MCG/2ML IJ SOLN
100.0000 ug | INTRAMUSCULAR | Status: DC | PRN
  Administered 2017-11-29 (×3): 100 ug via INTRAVENOUS
  Filled 2017-11-29 (×3): qty 2

## 2017-11-29 MED ORDER — NALOXONE HCL 0.4 MG/ML IJ SOLN
INTRAMUSCULAR | Status: AC
  Filled 2017-11-29: qty 1

## 2017-11-29 MED ORDER — OXYTOCIN 40 UNITS IN LACTATED RINGERS INFUSION - SIMPLE MED
2.5000 [IU]/h | INTRAVENOUS | Status: DC
  Administered 2017-11-29: 2.5 [IU]/h via INTRAVENOUS

## 2017-11-29 MED ORDER — SENNOSIDES-DOCUSATE SODIUM 8.6-50 MG PO TABS
2.0000 | ORAL_TABLET | ORAL | Status: DC
  Administered 2017-11-30 (×2): 2 via ORAL
  Filled 2017-11-29 (×2): qty 2

## 2017-11-29 MED ORDER — OXYCODONE-ACETAMINOPHEN 5-325 MG PO TABS: 1.0000 | ORAL_TABLET | ORAL | Status: DC | PRN

## 2017-11-29 MED ORDER — PENICILLIN G POTASSIUM 5000000 UNITS IJ SOLR
5.0000 10*6.[IU] | Freq: Once | INTRAVENOUS | Status: AC
  Administered 2017-11-29: 5 10*6.[IU] via INTRAVENOUS
  Filled 2017-11-29: qty 5

## 2017-11-29 MED ORDER — PRENATAL MULTIVITAMIN CH
1.0000 | ORAL_TABLET | Freq: Every day | ORAL | Status: DC
  Administered 2017-11-30 – 2017-12-01 (×2): 1 via ORAL
  Filled 2017-11-29 (×2): qty 1

## 2017-11-29 MED ORDER — TERBUTALINE SULFATE 1 MG/ML IJ SOLN
0.2500 mg | Freq: Once | INTRAMUSCULAR | Status: DC | PRN
  Filled 2017-11-29: qty 1

## 2017-11-29 MED ORDER — ONDANSETRON HCL 4 MG/2ML IJ SOLN
4.0000 mg | Freq: Four times a day (QID) | INTRAMUSCULAR | Status: DC | PRN
  Administered 2017-11-29: 4 mg via INTRAVENOUS
  Filled 2017-11-29: qty 2

## 2017-11-29 MED ORDER — DIBUCAINE 1 % RE OINT: 1.0000 "application " | TOPICAL_OINTMENT | RECTAL | Status: DC | PRN

## 2017-11-29 MED ORDER — WITCH HAZEL-GLYCERIN EX PADS: 1.0000 "application " | MEDICATED_PAD | CUTANEOUS | Status: DC | PRN

## 2017-11-29 MED ORDER — COCONUT OIL OIL: 1.0000 "application " | TOPICAL_OIL | Status: DC | PRN

## 2017-11-29 MED ORDER — MISOPROSTOL 200 MCG PO TABS
ORAL_TABLET | ORAL | Status: AC
  Administered 2017-11-29: 600 ug via ORAL
  Filled 2017-11-29: qty 3

## 2017-11-29 MED ORDER — LACTATED RINGERS IV SOLN
INTRAVENOUS | Status: DC
  Administered 2017-11-29 (×2): via INTRAVENOUS

## 2017-11-29 MED ORDER — TETANUS-DIPHTH-ACELL PERTUSSIS 5-2.5-18.5 LF-MCG/0.5 IM SUSP: 0.5000 mL | Freq: Once | INTRAMUSCULAR | Status: DC

## 2017-11-29 MED ORDER — ONDANSETRON HCL 4 MG/2ML IJ SOLN: 4.0000 mg | INTRAMUSCULAR | Status: DC | PRN

## 2017-11-29 MED ORDER — SIMETHICONE 80 MG PO CHEW: 80.0000 mg | CHEWABLE_TABLET | ORAL | Status: DC | PRN

## 2017-11-29 MED ORDER — ZOLPIDEM TARTRATE 5 MG PO TABS: 5.0000 mg | ORAL_TABLET | Freq: Every evening | ORAL | Status: DC | PRN

## 2017-11-29 MED ORDER — ACETAMINOPHEN 325 MG PO TABS
650.0000 mg | ORAL_TABLET | ORAL | Status: DC | PRN
  Administered 2017-11-30: 650 mg via ORAL
  Filled 2017-11-29: qty 2

## 2017-11-29 MED ORDER — MISOPROSTOL 200 MCG PO TABS
600.0000 ug | ORAL_TABLET | Freq: Once | ORAL | Status: AC
  Administered 2017-11-29: 600 ug via ORAL

## 2017-11-29 MED ORDER — MISOPROSTOL 25 MCG QUARTER TABLET: 25.0000 ug | ORAL_TABLET | ORAL | Status: DC | PRN

## 2017-11-29 MED ORDER — OXYTOCIN BOLUS FROM INFUSION
500.0000 mL | Freq: Once | INTRAVENOUS | Status: AC
  Administered 2017-11-29: 500 mL via INTRAVENOUS

## 2017-11-29 MED ORDER — OXYTOCIN 40 UNITS IN LACTATED RINGERS INFUSION - SIMPLE MED
1.0000 m[IU]/min | INTRAVENOUS | Status: DC
  Administered 2017-11-29: 2 m[IU]/min via INTRAVENOUS
  Filled 2017-11-29: qty 1000

## 2017-11-29 MED ORDER — SOD CITRATE-CITRIC ACID 500-334 MG/5ML PO SOLN
30.0000 mL | ORAL | Status: DC | PRN
Start: 2017-11-29 — End: 2017-11-29

## 2017-11-29 MED ORDER — FLEET ENEMA 7-19 GM/118ML RE ENEM: 1.0000 | ENEMA | RECTAL | Status: DC | PRN

## 2017-11-29 MED ORDER — BENZOCAINE-MENTHOL 20-0.5 % EX AERO
1.0000 "application " | INHALATION_SPRAY | CUTANEOUS | Status: DC | PRN
  Filled 2017-11-29: qty 56

## 2017-11-29 MED ORDER — LIDOCAINE HCL (PF) 1 % IJ SOLN
30.0000 mL | INTRAMUSCULAR | Status: DC | PRN
  Administered 2017-11-29: 30 mL via SUBCUTANEOUS
  Filled 2017-11-29: qty 30

## 2017-11-29 MED ORDER — DIPHENHYDRAMINE HCL 25 MG PO CAPS: 25.0000 mg | ORAL_CAPSULE | Freq: Four times a day (QID) | ORAL | Status: DC | PRN

## 2017-11-29 MED ORDER — IBUPROFEN 600 MG PO TABS
600.0000 mg | ORAL_TABLET | Freq: Four times a day (QID) | ORAL | Status: DC
  Administered 2017-11-30 – 2017-12-01 (×7): 600 mg via ORAL
  Filled 2017-11-29 (×8): qty 1

## 2017-11-29 MED ORDER — LACTATED RINGERS IV SOLN: 500.0000 mL | INTRAVENOUS | Status: DC | PRN

## 2017-11-29 MED ORDER — OXYCODONE-ACETAMINOPHEN 5-325 MG PO TABS: 2.0000 | ORAL_TABLET | ORAL | Status: DC | PRN

## 2017-11-29 MED ORDER — PENICILLIN G POT IN DEXTROSE 60000 UNIT/ML IV SOLN
3.0000 10*6.[IU] | INTRAVENOUS | Status: DC
  Administered 2017-11-29 (×3): 3 10*6.[IU] via INTRAVENOUS
  Filled 2017-11-29 (×4): qty 50

## 2017-11-29 MED ORDER — ONDANSETRON HCL 4 MG PO TABS: 4.0000 mg | ORAL_TABLET | ORAL | Status: DC | PRN

## 2017-11-29 MED ORDER — MISOPROSTOL 50MCG HALF TABLET
50.0000 ug | ORAL_TABLET | ORAL | Status: DC | PRN
  Administered 2017-11-29: 50 ug via ORAL
  Filled 2017-11-29 (×2): qty 1

## 2017-11-29 MED ORDER — ACETAMINOPHEN 325 MG PO TABS
650.0000 mg | ORAL_TABLET | ORAL | Status: DC | PRN
  Administered 2017-11-29: 650 mg via ORAL
  Filled 2017-11-29: qty 2

## 2017-11-29 NOTE — Progress Notes (Signed)
Labor Progress Note  Kevin FentonSierra Harbor is a 26 y.o. G2P1001 at 6364w0d  admitted for induction of labor due to Gestational diabetes.  S: Doing well. Still tolerating contractions with pain medication.   O:  BP (!) 113/58   Pulse 70   Temp 97.9 F (36.6 C) (Oral)   Resp 20   Ht 5\' 4"  (1.626 m)   Wt 126.6 kg (279 lb)   LMP 03/01/2017 (Approximate)   BMI 47.89 kg/m   No intake/output data recorded.  FHT:  FHR: 130 bpm, variability: moderate,  accelerations:  Present,  decelerations:  Absent UC:   regular, every 2-3 minutes SVE:   Dilation: 5 Effacement (%): 50, 60 Station: -2 Exam by:: dr Doroteo Glassmanphelps AROM 1820, clear fluid  Pitocin @ 118mu/min  Labs: Lab Results  Component Value Date   WBC 7.7 11/29/2017   HGB 11.0 (L) 11/29/2017   HCT 33.9 (L) 11/29/2017   MCV 84.1 11/29/2017   PLT 227 11/29/2017    Assessment / Plan: 26 y.o. G2P1001 6264w0d in latent labor Induction of labor due to gestational diabetes  Labor:  Progressing on Pitocin, AROM'd A2GDM: CBGs wnl Fetal Wellbeing:  Category I Pain Control:  Labor support without medications Anticipated MOD:  NSVD  Expectant management   Caryl AdaJazma Phelps, DO OB Fellow

## 2017-11-29 NOTE — Progress Notes (Signed)
Labor Progress Note  Amy Fitzgerald is a 26 y.o. G2P1001 at 7081w0d  admitted for induction of labor due to Gestational diabetes.  S: Doing well. States she is just having some mild irregular contractions.   O:  BP 124/83   Pulse 81   Temp 98.2 F (36.8 C) (Oral)   Resp 16   Ht 5\' 4"  (1.626 m)   Wt 126.6 kg (279 lb)   LMP 03/01/2017 (Approximate)   BMI 47.89 kg/m   No intake/output data recorded.  FHT:  FHR: 140 bpm, variability: moderate,  accelerations:  Present,  decelerations:  Absent UC:   regular, every 2-5 minutes SVE:   Dilation: 3 Effacement (%): Thick Station: -2 Exam by:: Dr. Doroteo GlassmanPhelps Membranes intact  Labs: Lab Results  Component Value Date   WBC 7.7 11/29/2017   HGB 11.0 (L) 11/29/2017   HCT 33.9 (L) 11/29/2017   MCV 84.1 11/29/2017   PLT 227 11/29/2017    Assessment / Plan: 26 y.o. G2P1001 3081w0d in latent labor Induction of labor due to gestational diabetes  Labor: Progressing normally, continue cytotec, FB placed A2GDM: CBGs wnl Fetal Wellbeing:  Category I Pain Control:  Labor support without medications Anticipated MOD:  NSVD  Expectant management   Caryl AdaJazma Phelps, DO OB Fellow

## 2017-11-29 NOTE — Consult Note (Signed)
Neonatology Note:  Attendance at Code Apgar:   Our team responded to a Code Apgar call to room # 167 following NSVD, due to infant with apnea post 45 second shoulder dystocia. The requesting physician was Dr. Doroteo GlassmanPhelps. The mother is a  G2P1001, GBS positive with aIAP. ROM occurred 4 hours PTD and the fluid was clear.  At delivery, the baby was without tone or respiratory effort. The OB nursing staff in attendance gave vigorous stimulation and a Code Apgar was called. Our team arrived at 1 minute of life, at which time the baby was being warmed and stimulated. We continued and at 1min 15sec of life, he began coughing with spontaneous breathing and activity.  Improvements in tone, grimacing and color encsued.  Ap 2/9.  I spoke with the Dad in the DR, then transferred the baby to the Pediatrician's care.   Please do not hesitate in contacting us if further concerns.   Dineen Kidavid C. Leary RocaEhrmann, MD

## 2017-11-29 NOTE — Anesthesia Pain Management Evaluation Note (Signed)
  CRNA Pain Management Visit Note  Patient: Amy Fitzgerald, 26 y.o., female  "Hello I am a member of the anesthesia team at [facility]. We have an anesthesia team available at all times to provide care throughout the hospital, including epidural management and anesthesia for C-section. I don't know your plan for the delivery whether it a natural birth, water birth, IV sedation, nitrous supplementation, doula or epidural, but we want to meet your pain goals."   1.Was your pain managed to your expectations on prior hospitalizations?   Yes   2.What is your expectation for pain management during this hospitalization?     IV pain meds  3.How can we help you reach that goal? Nursing interventions.  Record the patient's initial score and the patient's pain goal.   Pain: 5  Pain Goal: 7 [facility] wants you to be able to say your pain was always managed very well.  Zadrian Mccauley 11/29/2017

## 2017-11-29 NOTE — H&P (Signed)
Obstetric History and Physical  Amy Fitzgerald is a 26 y.o. G2P1001 with IUP at [redacted]w[redacted]d presenting for IOL for A2GDM. Patient states CBGs have been well controlled. She is compliant with her glyburide. Patient states she has been having  irregular contractions, none vaginal bleeding, intact membranes, with active fetal movement.  H/o preeclampsia in prior pregnancy but no blood pressure concerns in this pregnancy. No blurry vision, headaches or peripheral edema, and RUQ pain.   Prenatal Course Source of Care: [facility]  with onset of care at 14 weeks Dating: By LMP--->  Estimated Date of Delivery: 12/06/17 Pregnancy complications or risks: Patient Active Problem List   Diagnosis Date Noted  . Indication for care in labor or delivery 11/29/2017  . GBS (group B Streptococcus carrier), +RV culture, currently pregnant 11/21/2017  . Supervision of high risk pregnancy, antepartum 07/13/2017  . GDM, class A2 06/28/2017  . Low vitamin D level 06/14/2017  . History of pre-eclampsia in prior pregnancy, currently pregnant 06/08/2017  . Morbid obesity (HCC) 01/23/2016   She plans to breastfeed She is undecided for postpartum contraception.   Sono:    @[redacted]w[redacted]d, CWD, normal anatomy, cephalic presentation, anterior placenta, 3549g, 90% EFW, normal AFI, BPP 8/8  Prenatal labs and studies: ABO, Rh: --/--/PENDING (12/24 0840) Antibody: NEG (12/24 0840) Rubella: 1.45 (07/03 1628) RPR: Non Reactive (10/04 1640)  HBsAg: Negative (07/03 1628)  HIV:   Non-Reactive GBS:Positive (12/13 1109) 2 hr Glucola  Abnormal 117/152/124 Genetic screening normal Anatomy US normal  Prenatal Transfer Tool  Maternal Diabetes: Yes:  Diabetes Type:  Insulin/Medication controlled Genetic Screening: Normal Maternal Ultrasounds/Referrals: Normal Fetal Ultrasounds or other Referrals:  None Maternal Substance Abuse:  No Significant Maternal Medications:  Meds include: Other: glyburide Significant Maternal Lab Results: Lab values  include: Group B Strep positive  Past Medical History:  Diagnosis Date  . 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 Anemia    During pregnancy  . Gestational diabetes    glyburide  . Preeclampsia     Past Surgical History:  Procedure Laterality Date  . NO PAST SURGERIES      OB History  Gravida Para Term Preterm AB Living  2 1 1     1  SAB TAB Ectopic Multiple Live Births        0 1    # Outcome Date GA Lbr Len/2nd Weight Sex Delivery Anes PTL Lv  2 Current           1 Term 04/01/16 [redacted]w[redacted]d 11:23 / 00:12 3.626 kg (7 lb 15.9 oz) F Vag-Spont None  LIV     Birth Comments: pre eclampsia      Social History   Socioeconomic History  . Marital status: Single    Spouse name: None  . Number of children: None  . Years of education: None  . Highest education level: None  Social Needs  . Financial resource strain: None  . Food insecurity - worry: None  . Food insecurity - inability: None  . Transportation needs - medical: None  . Transportation needs - non-medical: None  Occupational History  . None  Tobacco Use  . Smoking status: Never Smoker  . Smokeless tobacco: Never Used  Substance and Sexual Activity  . Alcohol use: No  . Drug use: No  . Sexual activity: Not Currently    Birth control/protection: Pill  Other Topics Concern  . None  Social History Narrative  . None    Family History  Problem Relation Age of Onset  .   Arthritis Brother   . Diabetes Maternal Grandmother   . Cancer Maternal Grandmother   . Arthritis Maternal Grandmother   . Cancer Maternal Grandfather     Medications Prior to Admission  Medication Sig Dispense Refill Last Dose  . glyBURIDE (DIABETA) 5 MG tablet Take 1 tablet (5 mg total) 2 (two) times daily with a meal by mouth. (Patient taking differently: Take 15 mg by mouth 2 (two) times daily with a meal. ) 60 tablet 3 11/28/2017 at Unknown time  . Prenat-FeAsp-Meth-FA-DHA w/o A (PRENATE PIXIE) 10-0.6-0.4-200 MG CAPS Take 1 tablet by mouth daily.  30 capsule 12 11/28/2017 at Unknown time  . Blood Glucose Monitoring Suppl (ACCU-CHEK GUIDE) w/Device KIT 1 kit 4 (four) times daily by Does not apply route. 1 kit 0 Taking  . glucose blood (ACCU-CHEK GUIDE) test strip Use as instructed 100 each 12 Taking  . Lancets Misc. (ACCU-CHEK FASTCLIX LANCET) KIT 1 Device by Does not apply route 4 (four) times daily. 1 kit 12 Taking  . Vitamin D, Ergocalciferol, (DRISDOL) 50000 units CAPS capsule Take 1 capsule (50,000 Units total) by mouth every 7 (seven) days. 30 capsule 2 11/21/2017    No Known Allergies  Review of Systems: Negative except for what is mentioned in HPI.  Physical Exam: BP 122/79   Pulse 84   Temp 97.6 F (36.4 C) (Oral)   Resp 18   Ht 5' 4" (1.626 m)   Wt 126.6 kg (279 lb)   LMP 03/01/2017 (Approximate)   BMI 47.89 kg/m  CONSTITUTIONAL: Well-developed, well-nourished female in no acute distress.  HENT:  Normocephalic, atraumatic, External right and left ear normal. Oropharynx is clear and moist EYES: Conjunctivae and EOM are normal. Pupils are equal, round, and reactive to light. No scleral icterus.  NECK: Normal range of motion, supple, no masses SKIN: Skin is warm and dry. No rash noted. Not diaphoretic. No erythema. No pallor. NEUROLOGIC: Alert and oriented to person, place, and time. Normal reflexes, muscle tone coordination. No cranial nerve deficit noted. PSYCHIATRIC: Normal mood and affect. Normal behavior. Normal judgment and thought content. CARDIOVASCULAR: Normal heart rate noted, regular rhythm RESPIRATORY: Effort and breath sounds normal, no problems with respiration noted ABDOMEN: Soft, nontender, nondistended, gravid. MUSCULOSKELETAL: Normal range of motion. No edema and no tenderness. 2+ distal pulses.  Cervical Exam: Dilation: 2 Effacement (%): 50 Station: -2 Presentation: Vertex Exam by:: Catherine Stout RN  Presentation: cephalic FHT:  Baseline rate 140 bpm   Variability moderate  Accelerations  present   Decelerations none Contractions: irregular   Pertinent Labs/Studies:   Results for orders placed or performed during the hospital encounter of 11/29/17 (from the past 24 hour(s))  CBC     Status: Abnormal   Collection Time: 11/29/17  8:40 AM  Result Value Ref Range   WBC 7.7 4.0 - 10.5 K/uL   RBC 4.03 3.87 - 5.11 MIL/uL   Hemoglobin 11.0 (L) 12.0 - 15.0 g/dL   HCT 33.9 (L) 36.0 - 46.0 %   MCV 84.1 78.0 - 100.0 fL   MCH 27.3 26.0 - 34.0 pg   MCHC 32.4 30.0 - 36.0 g/dL   RDW 14.3 11.5 - 15.5 %   Platelets 227 150 - 400 K/uL  Type and screen [facility]     Status: None (Preliminary result)   Collection Time: 11/29/17  8:40 AM  Result Value Ref Range   ABO/RH(D) PENDING    Antibody Screen NEG    Sample Expiration 12/02/2017     Glucose, capillary     Status: None   Collection Time: 11/29/17  8:40 AM  Result Value Ref Range   Glucose-Capillary 96 65 - 99 mg/dL    Assessment : Madhavi Patricelli is a 26 y.o. G2P1001 at [redacted]w[redacted]d being admitted for induction of labor due to GDM.  Plan: Labor: Induction as ordered as per protocol. Will start with cytotec. Analgesia as needed. FWB: Reassuring fetal heart tracing.   GBS positive - start PCN No signs of preeclampsia. BPs wnl A2GDM: fasting CBG on admission 96. Monitor q4hrs. Delivery plan: Hopeful for vaginal delivery   Jazma Phelps, DO OB [facility] -  11/29/2017, 10:31 AM      

## 2017-11-30 ENCOUNTER — Other Ambulatory Visit: Payer: Self-pay

## 2017-11-30 LAB — GLUCOSE, CAPILLARY: GLUCOSE-CAPILLARY: 161 mg/dL — AB (ref 65–99)

## 2017-11-30 NOTE — Lactation Note (Signed)
This note was copied from a baby's chart. Lactation Consultation Note Baby 3 hrs old. Mom very tired, asked LC to come back in am. Baby was sleeping and mom stated wanted sleep as well. LC agreed.   LC encouraged mom to alert staff if notices baby getting jittery d/t mom w/GDM needs to monitor baby for sugar dropping. Encouraged mom STS and hand expression. Encouraged mom to wake baby every 3 hrs if hasn't to cued to feed. Encouraged to call for assistance if needed. Mom encouraged to feed baby 8-12 times/24 hours and with feeding cues. WH/LC brochure given w/resources, support groups and LC services. Patient Name: Amy [name] NWGNF'AToday's Date: 11/30/2017 Reason for consult: Initial assessment;Maternal endocrine disorder Type of Endocrine Disorder?: Diabetes   Maternal Data    Feeding Feeding Type: Breast Fed Length of feed: 6 min(off and on latching)  LATCH Score Latch: Repeated attempts needed to sustain latch, nipple held in mouth throughout feeding, stimulation needed to elicit sucking reflex.  Audible Swallowing: None  Type of Nipple: Everted at rest and after stimulation  Comfort (Breast/Nipple): Soft / non-tender  Hold (Positioning): Assistance needed to correctly position infant at breast and maintain latch.  LATCH Score: 6  Interventions    Lactation Tools Discussed/Used     Consult Status Consult Status: Follow-up Date: 11/30/17 Follow-up type: In-patient    Bettyjean Stefanski, Diamond NickelLAURA G 11/30/2017, 1:13 AM

## 2017-11-30 NOTE — Progress Notes (Signed)
Post Partum Day 1 Subjective: no complaints, up ad lib, voiding and tolerating PO  Objective: Blood pressure 121/78, pulse 92, temperature 98.6 F (37 C), temperature source Oral, resp. rate 18, height 5\' 4"  (1.626 m), weight 126.6 kg (279 lb), last menstrual period 03/01/2017, SpO2 100 %, unknown if currently breastfeeding.  Physical Exam:  General: alert, cooperative and no distress Lochia: appropriate Uterine Fundus: firm DVT Evaluation: No evidence of DVT seen on physical exam.  Recent Labs    11/29/17 0840  HGB 11.0*  HCT 33.9*    Assessment/Plan: Plan for discharge tomorrow, Breastfeeding and Lactation consult   LOS: 1 day   Caryl AdaJazma Stormie Ventola 11/30/2017, 7:11 AM

## 2017-11-30 NOTE — Lactation Note (Signed)
This note was copied from a baby's chart. Lactation Consultation Note  Patient Name: Amy Fitzgerald ZOXWR'UToday's Date: 11/30/2017 Reason for consult: Follow-up assessment Observed baby latched to left breast feeding well with stimulation.  Instructed to feed with cues and to call for assist prn. Mom states baby has not latched to right side.  Encouraged to attempt with next feeding and if difficulty latching call for assist.  Mom asking about pumping.  Discussed no need to pump at this time because baby is doing well.  Maternal Data    Feeding Feeding Type: Breast Fed Length of feed: 15 min  LATCH Score Latch: Grasps breast easily, tongue down, lips flanged, rhythmical sucking.  Audible Swallowing: A few with stimulation  Type of Nipple: Everted at rest and after stimulation  Comfort (Breast/Nipple): Soft / non-tender  Hold (Positioning): No assistance needed to correctly position infant at breast.  LATCH Score: 9  Interventions Interventions: Breast feeding basics reviewed;Breast massage  Lactation Tools Discussed/Used     Consult Status Consult Status: Follow-up Date: 12/01/17    Huston FoleyMOULDEN, Shakena Callari S 11/30/2017, 11:59 AM

## 2017-12-01 MED ORDER — IBUPROFEN 600 MG PO TABS: 600.0000 mg | ORAL_TABLET | Freq: Four times a day (QID) | ORAL | 0 refills | Status: DC

## 2017-12-01 NOTE — Lactation Note (Signed)
This note was copied from a baby's chart. Lactation Consultation Note  Patient Name: Amy Fitzgerald Date: 12/01/2017 Reason for consult: Follow-up assessment;Term;Infant weight loss(4% weight loss )  Baby is 35 hours  LC reviewed doc flow sheets. Per mom had to supplement during the night due to the baby being hungry, and the nurse  Set up the DEBP, pumped and no results.  LC explained to mom hand expressing and pumping can be a slow process, and the flow  Is up an down until the breast get fuller, heavier, and warmer.  LC reviewed basics for latching and assisted mom to latch on both breast,  Worked on obtaining depth. Per mom comfortable, several swallows noted. Discussed with mom nutritive vs non - nutritive feeding patterns, and the importance of hanging  Out latched, also how to release suction from the breast.  Mom denies soreness, sore nipple and engorgement prevention and tx reviewed.  LC instructed on a hand pump, has the DEBP kit, is active with [facility] [place] .  Mother informed of post-discharge support and given phone number to the lactation department, including services for phone call assistance; out-patient appointments; and breastfeeding support group. List of other breastfeeding resources in the community given in the handout. Encouraged mother to call for problems or concerns related to breastfeeding.      Maternal Data Has patient been taught Hand Expression?: Yes Does the patient have breastfeeding experience prior to this delivery?: No  Feeding Feeding Type: Breast Fed Length of feed: 15 min(swallows noted )  LATCH Score Latch: Grasps breast easily, tongue down, lips flanged, rhythmical sucking.  Audible Swallowing: Spontaneous and intermittent  Type of Nipple: Everted at rest and after stimulation  Comfort (Breast/Nipple): Soft / non-tender  Hold (Positioning): Assistance needed to correctly position infant at breast and maintain latch.  LATCH Score:  9  Interventions Interventions: Breast feeding basics reviewed;Assisted with latch;Skin to skin;Breast massage;Hand express;Breast compression;Support pillows;Position options  Lactation Tools Discussed/Used Tools: Pump;Coconut oil Breast pump type: Double-Electric Breast Pump;Manual WIC Program: Yes Pump Review: Milk Storage;Setup, frequency, and cleaning   Consult Status Consult Status: Complete Date: 12/01/17    Jerlyn Ly Cyndi Montejano 12/01/2017, 9:00 AM

## 2017-12-01 NOTE — Discharge Summary (Signed)
OB Discharge Summary     Patient Name: Amy Fitzgerald DOB: 17-Jun-1991 MRN: [id number]  Date of admission: 11/29/2017 Delivering MD: Katheren Shams   Date of discharge: 12/01/2017  Admitting diagnosis: 39 WKS, INDUCTION Intrauterine pregnancy: [redacted]w[redacted]d    Secondary diagnosis:  Active Problems:   Indication for care in labor or delivery  Additional problems: Gestational Diabetes     Discharge diagnosis: Term Pregnancy Delivered                                                                                                Post partum procedures:None  Augmentation: Pitocin, Cytotec and Foley Balloon  Complications: None  Hospital course:  Induction of Labor With Vaginal Delivery   26y.o. yo [id number] [id number] admitted to the hospital 11/29/2017 for induction of labor.  Indication for induction: A2 DM.  Patient had an uncomplicated labor course as follows: Membrane Rupture Time/Date: 6:20 PM ,11/29/2017   Intrapartum Procedures: Episiotomy: None [1]                                         Lacerations:  2nd degree [3]  Patient had delivery of a Viable infant.  Information for the patient's newborn:  LeDecklyn, Hornik0[[id number]]Delivery Method: Vaginal, Spontaneous(Filed from Delivery Summary)   11/29/2017  Details of delivery can be found in separate delivery note.  Patient had a routine postpartum course. Patient is discharged home 12/01/17.  Physical exam  Vitals:   11/30/17 0535 11/30/17 0900 11/30/17 1754 12/01/17 0611  BP: 121/78  118/72 120/67  Pulse: 92  82 77  Resp: 18  18 16   Temp: 98.6 F (37 C)  98.9 F (37.2 C) 98.4 F (36.9 C)  TempSrc: Oral  Oral Oral  SpO2:      Weight:  266 lb 2 oz (120.7 kg)    Height:       Subjective: No problems or concerns. Patient states doing well. Reports minimal bleeding and pain controlled with pain medications. Denies calf pain.  Breastfeeding.  Patient declines family planning at this time after review of the  methods.  General: alert, cooperative and appears stated age Lochia: appropriate Uterine Fundus: firm Incision: n/a DVT Evaluation: No evidence of DVT seen on physical exam. Negative Homan's sign.  Labs: Lab Results  Component Value Date   WBC 7.7 11/29/2017   HGB 11.0 (L) 11/29/2017   HCT 33.9 (L) 11/29/2017   MCV 84.1 11/29/2017   PLT 227 11/29/2017   CMP Latest Ref Rng & Units 07/06/2017  Glucose 65 - 99 mg/dL 143(H)  BUN 6 - 20 mg/dL 5(L)  Creatinine 0.57 - 1.00 mg/dL 0.66  Sodium 134 - 144 mmol/L 138  Potassium 3.5 - 5.2 mmol/L 3.9  Chloride 96 - 106 mmol/L 103  CO2 20 - 29 mmol/L 16(L)  Calcium 8.7 - 10.2 mg/dL 8.9  Total Protein 6.0 - 8.5 g/dL 6.8  Total Bilirubin 0.0 - 1.2 mg/dL <0.2  Alkaline Phos 39 -  117 IU/L 61  AST 0 - 40 IU/L 12  ALT 0 - 32 IU/L 6    Discharge instruction: per After Visit Summary and "Baby and Me Booklet".  After visit meds:  Allergies as of 12/01/2017   No Known Allergies     Medication List    STOP taking these medications   ACCU-CHEK FASTCLIX LANCET Kit   ACCU-CHEK GUIDE w/Device Kit   glucose blood test strip Commonly known as:  ACCU-CHEK GUIDE   glyBURIDE 5 MG tablet Commonly known as:  DIABETA     TAKE these medications   ibuprofen 600 MG tablet Commonly known as:  ADVIL,MOTRIN Take 1 tablet (600 mg total) by mouth every 6 (six) hours.   PRENATE PIXIE 10-0.6-0.4-200 MG Caps Take 1 tablet by mouth daily.   Vitamin D (Ergocalciferol) 50000 units Caps capsule Commonly known as:  DRISDOL Take 1 capsule (50,000 Units total) by mouth every 7 (seven) days.       Diet: routine diet  Activity: Advance as tolerated. Pelvic rest for 6 weeks.   Outpatient follow up:4 weeks Follow up Appt: Future Appointments  Date Time Provider Guy  12/28/2017  1:00 PM Chancy Milroy, MD [place] None  01/10/2018  8:30 AM [facility] None   Follow up Visit:No Follow-up on file.  Postpartum contraception:  None  Newborn Data: Live born female  Birth Weight: 10 lb 2.1 oz (4595 g) APGAR: 2, 9  Newborn Delivery   Time head delivered:  11/29/2017 21:48:00 Birth date/time:  11/29/2017 21:48:00 Delivery type:  Vaginal, Spontaneous     Baby Feeding: Breast Disposition:home with mother   12/01/2017 Kathrine Haddock, CNM

## 2017-12-01 NOTE — Discharge Instructions (Signed)

## 2017-12-02 ENCOUNTER — Other Ambulatory Visit: Payer: Medicaid Other

## 2017-12-28 ENCOUNTER — Ambulatory Visit: Payer: Medicaid Other | Admitting: Obstetrics and Gynecology

## 2017-12-29 ENCOUNTER — Encounter: Payer: Self-pay | Admitting: Obstetrics and Gynecology

## 2017-12-29 ENCOUNTER — Ambulatory Visit (INDEPENDENT_AMBULATORY_CARE_PROVIDER_SITE_OTHER): Payer: Medicaid Other | Admitting: Obstetrics and Gynecology

## 2017-12-29 DIAGNOSIS — Z8632 Personal history of gestational diabetes: Secondary | ICD-10-CM | POA: Insufficient documentation

## 2017-12-29 NOTE — Progress Notes (Signed)
Patient is in the office for pp visit, declines BC at this time.   Marland Kitchen..Post Partum Exam  Amy FentonSierra Fitzgerald is a 27 y.o. 682P2002 female who presents for a postpartum visit. She is 4 weeks postpartum following a spontaneous vaginal delivery. I have fully reviewed the prenatal and intrapartum course. The delivery was at 39.0 gestational weeks.  Anesthesia: IV sedation. Postpartum course has been good. Baby's course has been food. Baby is feeding by breast. Bleeding no bleeding. Bowel function is normal. Bladder function is normal. Patient is not sexually active. Contraception method is none. Postpartum depression screening:neg  The following portions of the patient's history were reviewed and updated as appropriate: allergies, current medications, past family history, past medical history, past social history and past surgical history. Last pap smear done 06/2017 and was Normal  Review of Systems Pertinent items are noted in HPI.    Objective:  Last menstrual period 03/01/2017, unknown if currently breastfeeding.  General:  alert   Breasts:  not examined  Lungs: clear to auscultation bilaterally  Heart:  regular rate and rhythm, S1, S2 normal, no murmur, click, rub or gallop  Abdomen: soft, non-tender; bowel sounds normal; no masses,  no organomegaly   Vulva:  not evaluated  Vagina: not evaluated  Cervix:  not evaluated  Corpus: not examined  Adnexa:  not evaluated  Rectal Exam: Not performed.        Assessment:    Nl postpartum exam.   H/O GDM  Plan:   1. Contraception: Pt declines.  2. Return to nl ADL's 3. Follow up in: 2 weeks for glucola d/t H/O GDM and in 1 year for GYN exam

## 2017-12-29 NOTE — Patient Instructions (Signed)

## 2018-01-10 ENCOUNTER — Other Ambulatory Visit: Payer: Medicaid Other

## 2018-01-10 DIAGNOSIS — Z8632 Personal history of gestational diabetes: Secondary | ICD-10-CM

## 2018-01-13 LAB — GLUCOSE TOLERANCE, 2 HOURS
Glucose, 2 hour: 166 mg/dL — ABNORMAL HIGH (ref 65–139)
Glucose, GTT - Fasting: 87 mg/dL (ref 65–99)

## 2018-02-24 IMAGING — US US MFM OB FOLLOW-UP
1 series · 14 of 28 positions shown · non-contrast
Comparison: none

[Series 1: us mfm ob follow-up · 28 acquisitions, 14 frames shown]
[im 2/28]
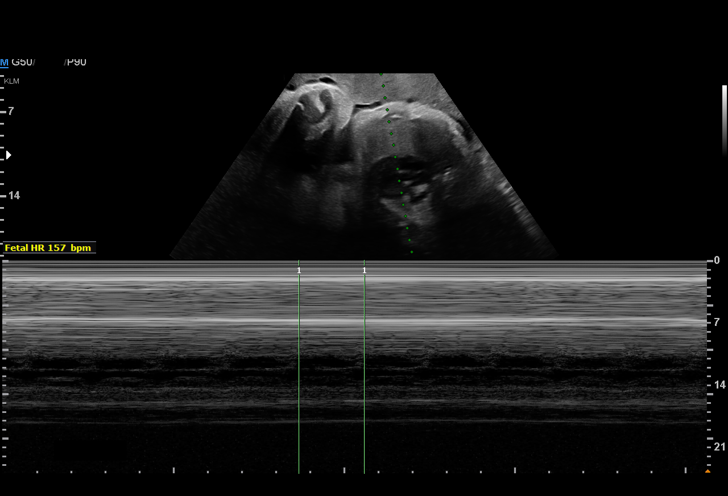
[im 4/28]
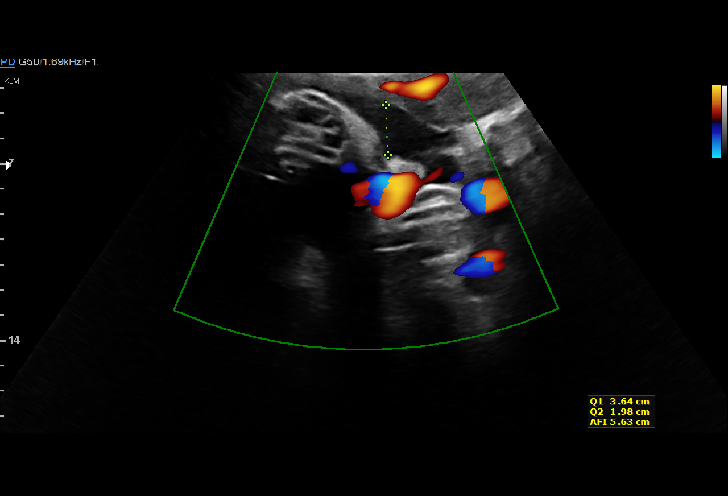
[im 6/28]
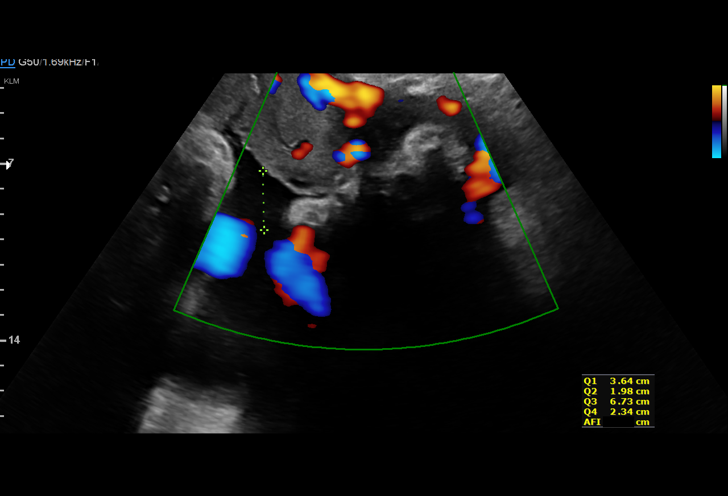
[im 8/28]
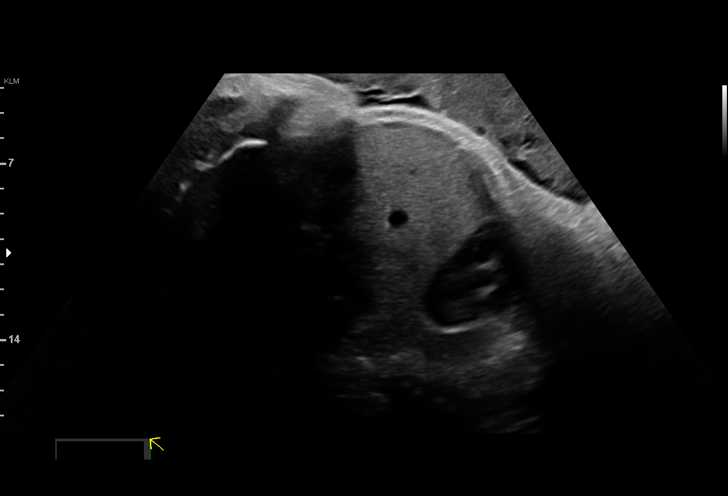
[im 10/28]
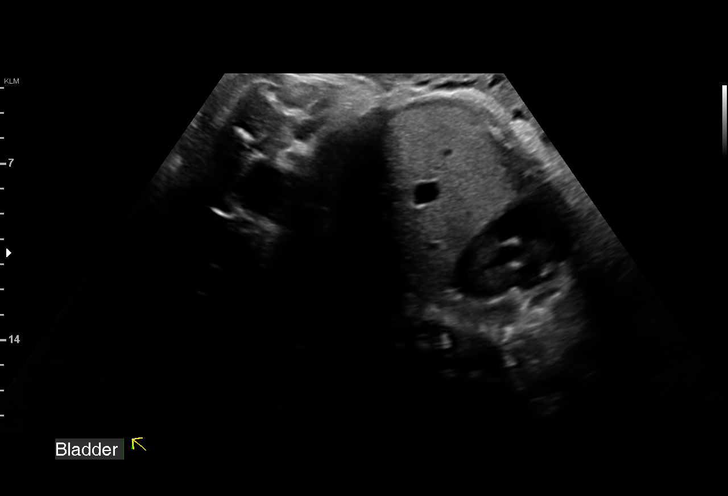
[im 12/28]
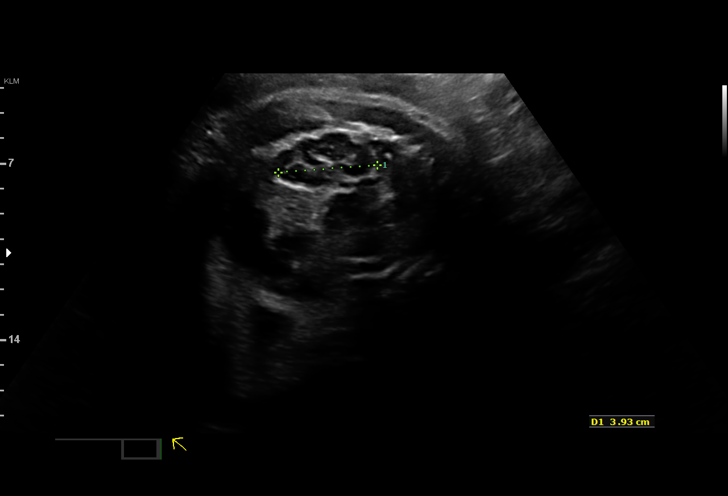
[im 14/28]
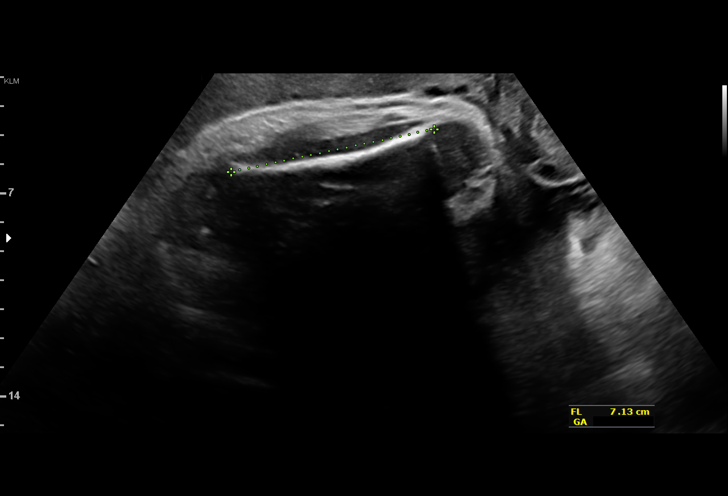
[im 16/28]
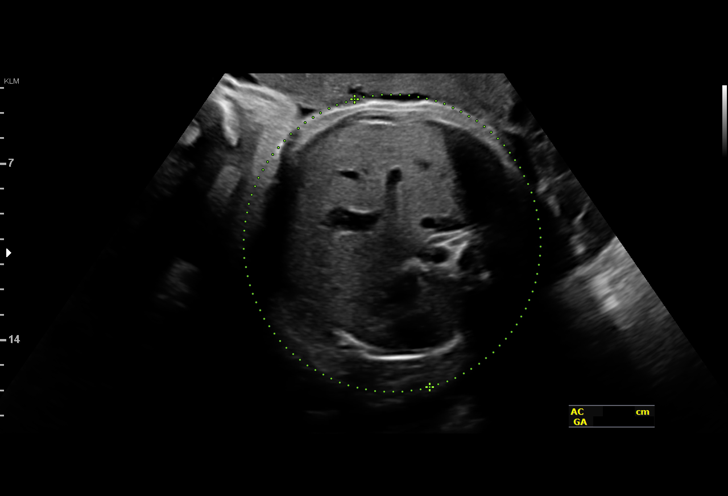
[im 18/28]
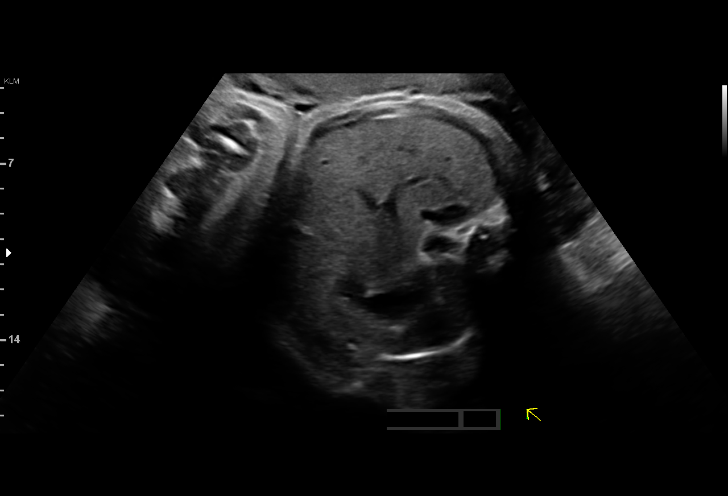
[im 20/28]
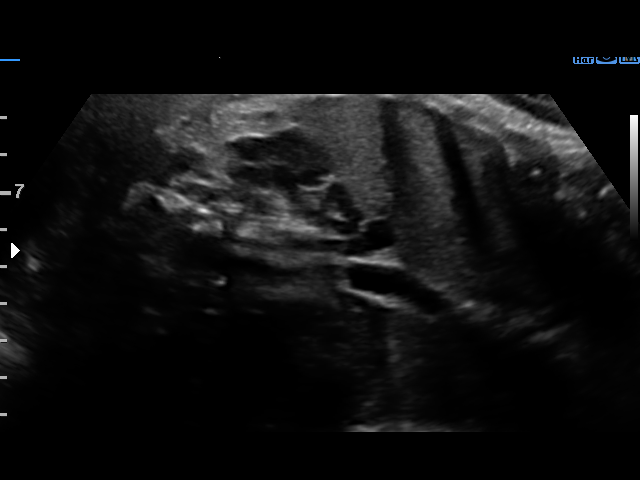
[im 22/28]
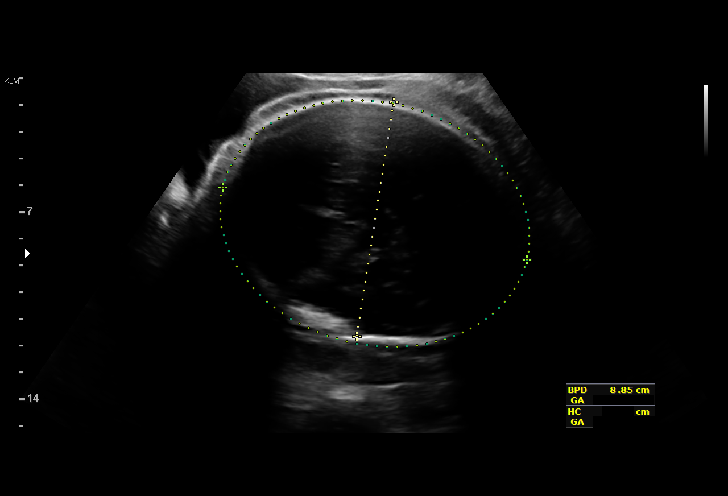
[im 24/28]
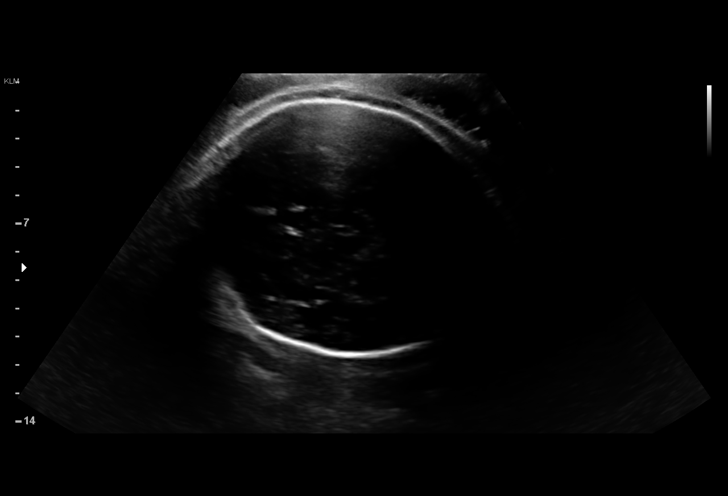
[im 26/28]
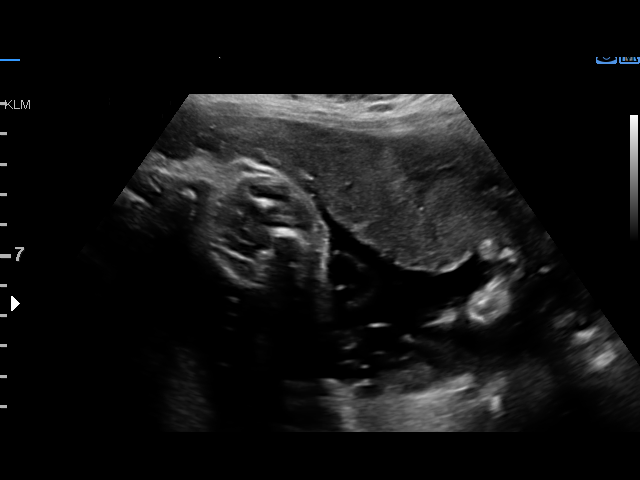
[im 28/28]
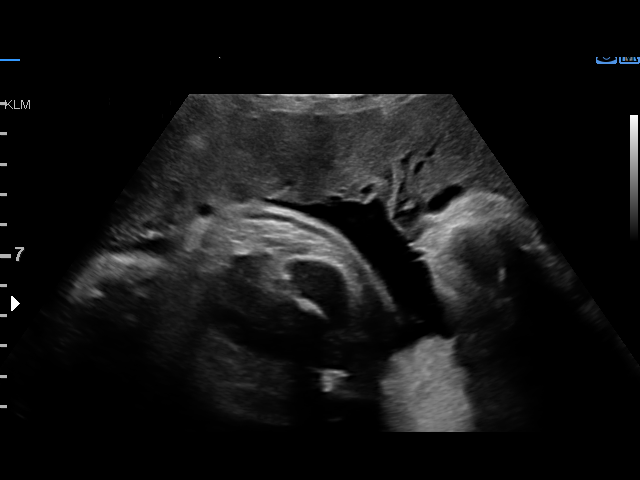

[14 of 28 positions shown; findings below may reference images not displayed]

[REDACTED]care - [HOSPITAL]

1  ROLO PILLAI           [id number]      [id number]     [id number]
2  WINNIE TIGER            [id number]      [id number]     [id number]
Indications

36 weeks gestation of pregnancy
Obesity complicating pregnancy, third
trimester
Gestational diabetes in pregnancy,
controlled by oral hypoglycemic drugs
OB History

Blood Type:            Height:  5'4"   Weight (lb):  255       BMI:
Gravidity:    2         Term:   1        Prem:   0        SAB:   0
TOP:          0       Ectopic:  0        Living: 1
Fetal Evaluation

Num Of Fetuses:     1
Fetal Heart         157
Rate(bpm):
Cardiac Activity:   Observed
Presentation:       Cephalic
Placenta:           Anterior, above cervical os
P. Cord Insertion:  Previously Visualized

Amniotic Fluid
AFI FV:      Subjectively within normal limits

AFI Sum(cm)     %Tile       Largest Pocket(cm)
14.69           54
RUQ(cm)       RLQ(cm)       LUQ(cm)        LLQ(cm)
3.64
Biophysical Evaluation

Amniotic F.V:   Within normal limits       F. Tone:        Observed
F. Movement:    Observed                   Score:          [DATE]
F. Breathing:   Observed
Biometry

BPD:      88.5  mm     G. Age:  35w 6d         46  %    CI:        74.36   %    70 - 86
FL/HC:      21.6   %    20.1 -
HC:      325.8  mm     G. Age:  37w 0d         36  %    HC/AC:      0.89        0.93 -
AC:      366.9  mm     G. Age:  40w 4d       > 97  %    FL/BPD:     79.7   %    71 - 87
FL:       70.5  mm     G. Age:  36w 1d         43  %    FL/AC:      19.2   %    20 - 24

Est. FW:    8429  gm    7 lb 13 oz    > 90  %
Gestational Age

LMP:           36w 2d        Date:  03/01/17                 EDD:   12/06/17
U/S Today:     37w 3d                                        EDD:   11/28/17
Best:          36w 2d     Det. By:  LMP  (03/01/17)          EDD:   12/06/17
Anatomy

Cranium:               Appears normal         Aortic Arch:            Previously seen
Cavum:                 Appears normal         Ductal Arch:            Previously seen
Ventricles:            Appears normal         Diaphragm:              Appears normal
Choroid Plexus:        Previously seen        Stomach:                Appears normal, left
sided
Cerebellum:            Previously seen        Abdomen:                Appears normal
Posterior Fossa:       Previously seen        Abdominal Wall:         Previously seen
Nuchal Fold:           Previously seen        Cord Vessels:           Previously seen
Face:                  Orbits and profile     Kidneys:                Appear normal
previously seen
Lips:                  Previously seen        Bladder:                Appears normal
Thoracic:              Appears normal         Spine:                  Previously seen
Heart:                 Previously seen        Upper Extremities:      Previously seen
RVOT:                  Previously seen        Lower Extremities:      Previously seen
LVOT:                  Previously seen

Other:  Fetus appears to be a male. Heels and right 5th digit prev visualized.
Nasal bone prev visualized.
Cervix Uterus Adnexa

Cervix
Not visualized (advanced GA >69wks)
Impression

SIUP at 36+2 weeks
Cephalic presentation
Normal interval anatomy; anatomic survey complete
Normal amniotic fluid volume
EFW > 90th %tile; AC > 97th %tile; fetus at risk to be
LGA/macrosomic
BPP [DATE]
Recommendations

Continue antenatal testing
Follow-up as clinically indicated

## 2019-02-05 DEATH — deceased

## 2020-01-01 ENCOUNTER — Ambulatory Visit: Payer: Medicaid Other | Attending: Internal Medicine

## 2020-01-01 DIAGNOSIS — Z20822 Contact with and (suspected) exposure to covid-19: Secondary | ICD-10-CM

## 2020-01-02 ENCOUNTER — Other Ambulatory Visit: Payer: Medicaid Other

## 2020-01-02 LAB — NOVEL CORONAVIRUS, NAA: SARS-CoV-2, NAA: NOT DETECTED
